# Patient Record
Sex: Male | Born: 1960 | Race: Black or African American | Hispanic: No | Marital: Married | Smoking: Never smoker
Health system: Southern US, Community
[De-identification: ages and names within clinical notes are randomized; demographics above are authoritative.]

## PROBLEM LIST (undated history)

## (undated) DIAGNOSIS — E785 Hyperlipidemia, unspecified: Secondary | ICD-10-CM

## (undated) DIAGNOSIS — I10 Essential (primary) hypertension: Secondary | ICD-10-CM

## (undated) HISTORY — DX: Essential (primary) hypertension: I10

## (undated) HISTORY — DX: Hyperlipidemia, unspecified: E78.5

---

## 2016-10-19 ENCOUNTER — Emergency Department (HOSPITAL_COMMUNITY)
Admission: EM | Admit: 2016-10-19 | Discharge: 2016-10-20 | Disposition: A | Discharge status: Home / Self Care | Payer: Commercial Managed Care - PPO | Attending: Emergency Medicine | Admitting: Emergency Medicine

## 2016-10-19 ENCOUNTER — Encounter (HOSPITAL_COMMUNITY): Payer: Self-pay | Admitting: *Deleted

## 2016-10-19 DIAGNOSIS — Z7982 Long term (current) use of aspirin: Secondary | ICD-10-CM | POA: Insufficient documentation

## 2016-10-19 DIAGNOSIS — N178 Other acute kidney failure: Secondary | ICD-10-CM

## 2016-10-19 DIAGNOSIS — N210 Calculus in bladder: Secondary | ICD-10-CM | POA: Insufficient documentation

## 2016-10-19 DIAGNOSIS — N289 Disorder of kidney and ureter, unspecified: Secondary | ICD-10-CM | POA: Insufficient documentation

## 2016-10-19 DIAGNOSIS — N200 Calculus of kidney: Secondary | ICD-10-CM | POA: Diagnosis not present

## 2016-10-19 DIAGNOSIS — N23 Unspecified renal colic: Secondary | ICD-10-CM

## 2016-10-19 DIAGNOSIS — R112 Nausea with vomiting, unspecified: Secondary | ICD-10-CM

## 2016-10-19 DIAGNOSIS — R197 Diarrhea, unspecified: Secondary | ICD-10-CM

## 2016-10-19 DIAGNOSIS — R1084 Generalized abdominal pain: Secondary | ICD-10-CM | POA: Diagnosis present

## 2016-10-19 DIAGNOSIS — N179 Acute kidney failure, unspecified: Secondary | ICD-10-CM | POA: Insufficient documentation

## 2016-10-19 DIAGNOSIS — R109 Unspecified abdominal pain: Secondary | ICD-10-CM

## 2016-10-19 LAB — URINALYSIS, ROUTINE W REFLEX MICROSCOPIC
Bilirubin Urine: NEGATIVE
Glucose, UA: NEGATIVE mg/dL
Ketones, ur: NEGATIVE mg/dL
LEUKOCYTES UA: NEGATIVE
Nitrite: NEGATIVE
PROTEIN: NEGATIVE mg/dL
SPECIFIC GRAVITY, URINE: 1.025 (ref 1.005–1.030)
pH: 5.5 (ref 5.0–8.0)

## 2016-10-19 LAB — COMPREHENSIVE METABOLIC PANEL
ALBUMIN: 4.8 g/dL (ref 3.5–5.0)
ALK PHOS: 61 U/L (ref 38–126)
ALT: 33 U/L (ref 17–63)
ANION GAP: 13 (ref 5–15)
AST: 32 U/L (ref 15–41)
BILIRUBIN TOTAL: 1.1 mg/dL (ref 0.3–1.2)
BUN: 23 mg/dL — ABNORMAL HIGH (ref 6–20)
CALCIUM: 9.4 mg/dL (ref 8.9–10.3)
CHLORIDE: 101 mmol/L (ref 101–111)
CO2: 19 mmol/L — AB (ref 22–32)
CREATININE: 1.99 mg/dL — AB (ref 0.61–1.24)
GFR, EST AFRICAN AMERICAN: 41 mL/min — AB (ref >60)
GFR, EST NON AFRICAN AMERICAN: 36 mL/min — AB (ref >60)
Glucose, Bld: 110 mg/dL — ABNORMAL HIGH (ref 65–99)
Potassium: 3.6 mmol/L (ref 3.5–5.1)
Sodium: 133 mmol/L — ABNORMAL LOW (ref 135–145)
Total Protein: 8.6 g/dL — ABNORMAL HIGH (ref 6.5–8.1)

## 2016-10-19 LAB — CBC
HCT: 42.1 % (ref 39.0–52.0)
HEMOGLOBIN: 14.7 g/dL (ref 13.0–17.0)
MCH: 30.8 pg (ref 26.0–34.0)
MCHC: 34.9 g/dL (ref 30.0–36.0)
MCV: 88.3 fL (ref 78.0–100.0)
PLATELETS: 302 10*3/uL (ref 150–400)
RBC: 4.77 MIL/uL (ref 4.22–5.81)
RDW: 13 % (ref 11.5–15.5)
WBC: 15.5 10*3/uL — AB (ref 4.0–10.5)

## 2016-10-19 LAB — URINE MICROSCOPIC-ADD ON: SQUAMOUS EPITHELIAL / LPF: NONE SEEN

## 2016-10-19 LAB — LIPASE, BLOOD: Lipase: 25 U/L (ref 11–51)

## 2016-10-19 NOTE — ED Provider Notes (Addendum)
[facility]-EMERGENCY DEPT Provider Note   CSN: [id number] Arrival date & time: 10/19/16  2155  By signing my name below, I, Alyssa GroveMartin Green, attest that this documentation has been prepared under the direction and in the presence of Blane OharaJoshua Tae Robak, MD. Electronically Signed: Alyssa GroveMartin Green, ED Scribe. 10/19/16. 12:37 AM.   History   Chief Complaint Chief Complaint  Patient presents with  . Abdominal Pain   Pt had a beef dog on 11/29 and afterwards experienced abdominal pain, vomiting, nausea, diaphoresis, chills. Pt also noticed deep yellow urine and slight discomfort when urinating. Vomiting and nausea has subsided, but abdominal pain has remained constant. He denies Abdominal PSHx. No PMhx of Kidney stones.   The history is provided by the patient. No language interpreter was used.  Abdominal Pain   This is a new problem. The current episode started more than 2 days ago. The problem occurs hourly. The problem has been gradually improving. The pain is associated with suspicious food intake. The pain is located in the generalized abdominal region. The quality of the pain is sharp and cramping. The pain is moderate. Associated symptoms include fever, nausea, vomiting, constipation and dysuria. Nothing aggravates the symptoms. Nothing relieves the symptoms.    History reviewed. No pertinent past medical history.  There are no active problems to display for this patient.   History reviewed. No pertinent surgical history.   Home Medications    Prior to Admission medications   Medication Sig Start Date End Date Taking? Authorizing Provider  Aspirin-Salicylamide-Caffeine (BC HEADACHE POWDER PO) Take 1 each by mouth every 6 (six) hours as needed (pain).   Yes Historical Provider, MD  Melatonin 1 MG TABS Take 1 mg by mouth once.   Yes Historical Provider, MD    Family History No family history on file.  Social History Social History  Substance Use Topics  . Smoking status: Never Smoker  .  Smokeless tobacco: Never Used  . Alcohol use Yes     Comment: occ   Allergies   Other  Review of Systems Review of Systems  Constitutional: Positive for fever.  Gastrointestinal: Positive for abdominal pain, constipation, nausea and vomiting.  Genitourinary: Positive for dysuria.  All other systems reviewed and are negative.  Physical Exam Updated Vital Signs BP (!) 160/106 (BP Location: Right Arm) Comment: informed the nurse  Pulse 82   Temp 98.5 F (36.9 C) (Oral)   Resp 19   Ht 6' (1.829 m)   Wt 220 lb (99.8 kg)   SpO2 100%   BMI 29.84 kg/m   Physical Exam  Constitutional: He is oriented to person, place, and time. He appears well-developed and well-nourished. He is active. No distress.  HENT:  Head: Normocephalic and atraumatic.  Eyes: Conjunctivae are normal.  Cardiovascular: Normal rate.   Pulmonary/Chest: Effort normal. No respiratory distress.  Musculoskeletal: Normal range of motion.  Neurological: He is alert and oriented to person, place, and time.  Skin: Skin is warm and dry.  Psychiatric: He has a normal mood and affect. His behavior is normal.  Nursing note and vitals reviewed.   ED Treatments / Results  DIAGNOSTIC STUDIES: Oxygen Saturation is 98% on RA, normal by my interpretation.    COORDINATION OF CARE: 12:36 AM Discussed treatment plan with pt at bedside which includes Abdominal US, CT Renal Stone Study and Vicodin  and pt agreed to plan.  Labs (all labs ordered are listed, but only abnormal results are displayed) Labs Reviewed  COMPREHENSIVE METABOLIC PANEL - Abnormal;  Notable for the following:       Result Value   Sodium 133 (*)    CO2 19 (*)    Glucose, Bld 110 (*)    BUN 23 (*)    Creatinine, Ser 1.99 (*)    Total Protein 8.6 (*)    GFR calc non Af Amer 36 (*)    GFR calc Af Amer 41 (*)    All other components within normal limits  CBC - Abnormal; Notable for the following:    WBC 15.5 (*)    All other components within normal  limits  URINALYSIS, ROUTINE W REFLEX MICROSCOPIC (NOT AT [facility]) - Abnormal; Notable for the following:    Hgb urine dipstick SMALL (*)    All other components within normal limits  URINE MICROSCOPIC-ADD ON - Abnormal; Notable for the following:    Bacteria, UA RARE (*)    All other components within normal limits  LIPASE, BLOOD    EKG  EKG Interpretation None       Radiology Ct Renal Stone Study  Result Date: 10/20/2016 CLINICAL DATA:  Abdomen pain with nausea and vomiting EXAM: CT ABDOMEN AND PELVIS WITHOUT CONTRAST TECHNIQUE: Multidetector CT imaging of the abdomen and pelvis was performed following the standard protocol without IV contrast. COMPARISON:  None. FINDINGS: Lower chest: Linear atelectasis or scar at both lung bases. No acute consolidation or pleural effusion. Heart size is nonenlarged. Distal esophagus normal. Hepatobiliary: Mild diffuse decreased density of the hepatic parenchyma consistent with fatty change. Gallbladder is slightly distended, no calcified stones. Mild fatty sparing at the gallbladder fossa. No biliary dilatation. Pancreas: Unremarkable. No pancreatic ductal dilatation or surrounding inflammatory changes. Spleen: Normal in size without focal abnormality. Adrenals/Urinary Tract: Bilateral adrenal glands are within normal limits. Punctate stone in the lower pole left kidney on coronal views. There is mild right-sided hydronephrosis and hydroureter with soft tissue stranding around the right ureter. No calcified stones are seen along the course of the right ureter, however there is a 3 mm stone within the posterior aspect of the bladder. Stomach/Bowel: Stomach is nonenlarged. No dilated small bowel. Mild gaseous dilatation of the colon. Appendix is visualized and is normal. Vascular/Lymphatic: Atherosclerosis of the aorta. No abnormally enlarged lymph nodes. Reproductive: Prostate is unremarkable. Other: No abdominal wall hernia or abnormality. No abdominopelvic  ascites. Musculoskeletal: Degenerative changes of the thoracolumbar spine. Multiple lucent lesions are present, most notable at the endplates of the lumbar vertebra consistent with Schmorl's node. Probable hemangioma in L1. IMPRESSION: 1. Mild right hydronephrosis and hydroureter with soft tissue stranding around the right ureter. There is no ureteral stone seen on the right, however there is a 3 mm stone within the posterior bladder. Findings would be consistent with recently passed stone. 2. Punctate nonobstructing stone in the left kidney 3. Mild fatty infiltration of the liver with focal sparing near the gallbladder fossa. Electronically Signed   By: Jasmine Pang M.D.   On: 10/20/2016 02:31    Procedures Procedures (including critical care time)  EMERGENCY DEPARTMENT BILIARY ULTRASOUND INTERPRETATION "Study: Limited Abdominal Ultrasound of the gallbladder and common bile duct."  INDICATIONS: Abdominal pain Indication: Multiple views of the gallbladder and common bile duct were obtained in real-time with a Multi-frequency probe." PERFORMED BY:  Myself IMAGES ARCHIVED?: Yes FINDINGS: Gallstones absent, Gallbladder wall normal in thickness, Sonographic Ishida's sign absent and Common bile duct normal in size LIMITATIONS: Bowel Gas INTERPRETATION: Normal  CPT Code [phone] (limited abdominal)  Emergency Focused Ultrasound Exam Limited retroperitoneal  ultrasound of kidneys  Performed and interpreted by Dr. Jodi MourningZavitz Indication: flank pain Focused abdominal ultrasound with both kidneys imaged in transverse and longitudinal planes in real-time. Interpretation: no significant hydronephrosis visualized.   Images archived electronically  CPT Code: [id number] (limited retroperitoneal)   Medications Ordered in ED Medications  HYDROcodone-acetaminophen (NORCO/VICODIN) 5-325 MG per tablet 1-2 tablet (1 tablet Oral Given 10/20/16 0135)     Initial Impression / Assessment and Plan / ED Course  I  have reviewed the triage vital signs and the nursing notes.  Pertinent labs & imaging results that were available during my care of the patient were reviewed by me and considered in my medical decision making (see chart for details).  Clinical Course    Intermittent right flank pain, minimal currently.  Mild urinary sxs, no infection. Discussed colon related vs kidney or gallstone.  No gallstone on bedside US, no severe hydro. CT stone  In bladder. Discussed fup for renal fx and htn.  Pt improved, fup urology.  Results and differential diagnosis were discussed with the patient/parent/guardian. Xrays were independently reviewed by myself.  Close follow up outpatient was discussed, comfortable with the plan.   Medications  HYDROcodone-acetaminophen (NORCO/VICODIN) 5-325 MG per tablet 1-2 tablet (1 tablet Oral Given 10/20/16 0135)    Vitals:   10/19/16 2214 10/19/16 2216 10/20/16 0008 10/20/16 0252  BP: (!) 166/112  (!) 159/102 (!) 160/106  Pulse: 86  87 82  Resp: 17  16 19   Temp: 98.5 F (36.9 C)  98.5 F (36.9 C)   TempSrc: Oral  Oral   SpO2: 98%  99% 100%  Weight:  220 lb (99.8 kg)    Height:  6' (1.829 m)      Final diagnoses:  Nausea vomiting and diarrhea  Acute renal failure with other specified pathological lesion in kidney [facility](HCC)  Acute right flank pain  Ureteral colic    Final Clinical Impressions(s) / ED Diagnoses   Final diagnoses:  Nausea vomiting and diarrhea  Acute renal failure with other specified pathological lesion in kidney [facility](HCC)  Acute right flank pain  Ureteral colic    New Prescriptions New Prescriptions   No medications on file     Blane OharaJoshua Faheem Ziemann, MD 10/20/16 0309    Blane OharaJoshua Marena Witts, MD 10/20/16 [phone]

## 2016-10-19 NOTE — ED Triage Notes (Signed)
Pt reports R abd pain with n/v since Wednesday evening.  States he had food poisoning from eating beef sausage.  Pt reports intermittent sharp cramping in R side of his stomach.  Denies n/v today.

## 2016-10-20 ENCOUNTER — Emergency Department (HOSPITAL_COMMUNITY): Payer: Commercial Managed Care - PPO

## 2016-10-20 MED ORDER — HYDROCODONE-ACETAMINOPHEN 5-325 MG PO TABS
ORAL_TABLET | ORAL | Status: AC
  Filled 2016-10-20: qty 1

## 2016-10-20 MED ORDER — HYDROCODONE-ACETAMINOPHEN 5-325 MG PO TABS: 2.0000 | ORAL_TABLET | ORAL | 0 refills | Status: DC | PRN

## 2016-10-20 MED ORDER — HYDROCODONE-ACETAMINOPHEN 5-325 MG PO TABS
1.0000 | ORAL_TABLET | Freq: Once | ORAL | Status: AC
  Administered 2016-10-20: 1 via ORAL

## 2016-10-20 NOTE — Discharge Instructions (Signed)
Strain urine, return for fevers, uncontrolled pain or new symptoms. See urology this week.  If you were given medicines take as directed.  If you are on coumadin or contraceptives realize their levels and effectiveness is altered by many different medicines.  If you have any reaction (rash, tongues swelling, other) to the medicines stop taking and see a physician.    If your blood pressure was elevated in the ER make sure you follow up for management with a primary doctor or return for chest pain, shortness of breath or stroke symptoms.  Please follow up as directed and return to the ER or see a physician for new or worsening symptoms.  Thank you. Vitals:   10/19/16 2214 10/19/16 2216 10/20/16 0008 10/20/16 0252  BP: (!) 166/112  (!) 159/102 (!) 160/106  Pulse: 86  87 82  Resp: 17  16 19   Temp: 98.5 F (36.9 C)  98.5 F (36.9 C)   TempSrc: Oral  Oral   SpO2: 98%  99% 100%  Weight:  220 lb (99.8 kg)    Height:  6' (1.829 m)

## 2017-12-25 ENCOUNTER — Other Ambulatory Visit: Payer: Self-pay

## 2017-12-25 ENCOUNTER — Encounter: Payer: Self-pay | Admitting: Physician Assistant

## 2017-12-25 ENCOUNTER — Ambulatory Visit: Payer: Managed Care, Other (non HMO) | Admitting: Physician Assistant

## 2017-12-25 VITALS — BP 130/100 | HR 83 | Temp 98.4°F | Resp 16 | Ht 71.0 in | Wt 207.0 lb

## 2017-12-25 DIAGNOSIS — Z1211 Encounter for screening for malignant neoplasm of colon: Secondary | ICD-10-CM | POA: Diagnosis not present

## 2017-12-25 DIAGNOSIS — Z1329 Encounter for screening for other suspected endocrine disorder: Secondary | ICD-10-CM | POA: Diagnosis not present

## 2017-12-25 DIAGNOSIS — Z0000 Encounter for general adult medical examination without abnormal findings: Secondary | ICD-10-CM

## 2017-12-25 DIAGNOSIS — Z13220 Encounter for screening for lipoid disorders: Secondary | ICD-10-CM

## 2017-12-25 DIAGNOSIS — Z130 Encounter for screening for diseases of the blood and blood-forming organs and certain disorders involving the immune mechanism: Secondary | ICD-10-CM

## 2017-12-25 DIAGNOSIS — Z125 Encounter for screening for malignant neoplasm of prostate: Secondary | ICD-10-CM

## 2017-12-25 LAB — POCT URINALYSIS DIP (MANUAL ENTRY)
Bilirubin, UA: NEGATIVE
Blood, UA: NEGATIVE
Glucose, UA: NEGATIVE mg/dL
Leukocytes, UA: NEGATIVE
Nitrite, UA: NEGATIVE
Spec Grav, UA: 1.025 (ref 1.010–1.025)
Urobilinogen, UA: 0.2 E.U./dL
pH, UA: 5 (ref 5.0–8.0)

## 2017-12-25 LAB — POC MICROSCOPIC URINALYSIS (UMFC): Mucus: ABSENT

## 2017-12-25 NOTE — Progress Notes (Signed)
Jermone Geister  MRN: [id number] DOB: 1961-09-18  PCP: Patient, No Pcp Per  Subjective:  Pt is a pleasant 57 year old male with no reported PMH presents to clinic for "blood work".  He would like a general health screening. He was requested to come in today by his wife.  He is feeling well today, no complaints.  He is not fasting today.   Pt does not take any medication on a daily basis.  Is sexually active with wife only. Not concerned today for STD screening.  Fhx: healthy. Father had pacemaker.   Colonoscopy never. Declines today. Will consider cologuard.  Hep C screening through work - negative.   Review of Systems  Constitutional: Negative for activity change, appetite change and fatigue.  HENT: Negative for congestion, dental problem, sneezing and tinnitus.   Eyes: Negative for visual disturbance.  Respiratory: Negative for cough, chest tightness, shortness of breath and wheezing.   Cardiovascular: Negative for chest pain, palpitations and leg swelling.  Gastrointestinal: Negative for abdominal pain, blood in stool, constipation, diarrhea, nausea and vomiting.  Endocrine: Negative for polydipsia, polyphagia and polyuria.  Genitourinary: Negative for decreased urine volume, difficulty urinating, discharge, hematuria, scrotal swelling and testicular pain.  Musculoskeletal: Negative for arthralgias, back pain and neck stiffness.  Allergic/Immunologic: Negative for environmental allergies and food allergies.  Neurological: Negative for dizziness, syncope, weakness, light-headedness and headaches.  Psychiatric/Behavioral: Negative for sleep disturbance. The patient is not nervous/anxious.     There are no active problems to display for this patient.   Current Outpatient Medications on File Prior to Visit  Medication Sig Dispense Refill  . Aspirin-Salicylamide-Caffeine (BC HEADACHE POWDER PO) Take 1 each by mouth every 6 (six) hours as needed (pain).    Marland Kitchen  HYDROcodone-acetaminophen (NORCO) 5-325 MG tablet Take 2 tablets by mouth every 4 (four) hours as needed. (Patient not taking: Reported on 12/25/2017) 6 tablet 0  . Melatonin 1 MG TABS Take 1 mg by mouth once.     No current facility-administered medications on file prior to visit.     Allergies  Allergen Reactions  . Other Itching, Swelling and Rash    Food: coleslaw      Objective:  BP 120/90   Pulse 83   Temp 98.4 F (36.9 C) (Oral)   Resp 16   Ht 5' 11"  (1.803 m)   Wt 207 lb (93.9 kg)   SpO2 100%   BMI 28.87 kg/m   Physical Exam  Constitutional: He is oriented to person, place, and time and well-developed, well-nourished, and in no distress. No distress.  Neck: Neck supple. No thyromegaly present.  Cardiovascular: Normal rate, regular rhythm and normal heart sounds.  Pulmonary/Chest: Effort normal and breath sounds normal.  Musculoskeletal:       Right ankle: He exhibits no swelling.       Left ankle: He exhibits no swelling.       Right lower leg: He exhibits no edema.       Left lower leg: He exhibits no edema.  Neurological: He is alert and oriented to person, place, and time. GCS score is 15.  Skin: Skin is warm and dry.  Psychiatric: Mood, memory, affect and judgment normal.  Vitals reviewed.   Assessment and Plan :  1. Encounter for routine adult medical examination - pt presents for routine medical evaluation. He is a new patient here. This is his first exam in several years. Feeling well today. Declines colonoscopy today - feels fine about trying cologuard.  Routine labs are pending, will contact with results. Anticipatory guidance provided. RTC in one year for annual.   2. Screen for colon cancer - Cologuard  3. Screening, lipid - Lipid panel  4. Screening for endocrine disorder - CMP14+EGFR - POCT urinalysis dipstick - POCT Microscopic Urinalysis ([facility])  5. Screening for prostate cancer - PSA  6. Screening for deficiency anemia - CBC with  Differential/Platelet   Mercer Pod, PA-C  [facility] 12/25/2017 10:04 AM

## 2017-12-25 NOTE — Patient Instructions (Addendum)
We will be in contact with you about your lab results.  You will receive Cologuard in the mail with instructions on how to complete the test.  See below for information regarding routine health maintenance for men.  Maintain a healthy diet with regular exercise.  Feel free to come back to our office if you need anything in the future.  Come back in one year for your annual exam.   Thank you for coming in today. I hope you feel we met your needs.  Feel free to call PCP if you have any questions or further requests.  Please consider signing up for MyChart if you do not already have it, as this is a great way to communicate with me.  Best,  Mercer Pod, PA-C   Health Maintenance, Male A healthy lifestyle and preventive care is important for your health and wellness. Ask your health care provider about what schedule of regular examinations is right for you. What should I know about weight and diet? Eat a Healthy Diet  Eat plenty of vegetables, fruits, whole grains, low-fat dairy products, and lean protein.  Do not eat a lot of foods high in solid fats, added sugars, or salt.  Maintain a Healthy Weight Regular exercise can help you achieve or maintain a healthy weight. You should:  Do at least 150 minutes of exercise each week. The exercise should increase your heart rate and make you sweat (moderate-intensity exercise).  Do strength-training exercises at least twice a week.  Watch Your Levels of Cholesterol and Blood Lipids  Have your blood tested for lipids and cholesterol every 5 years starting at 59 years of age. If you are at high risk for heart disease, you should start having your blood tested when you are 59 years old. You may need to have your cholesterol levels checked more often if: ? Your lipid or cholesterol levels are high. ? You are older than 59 years of age. ? You are at high risk for heart disease.  What should I know about cancer screening? Many types of cancers  can be detected early and may often be prevented. Lung Cancer  You should be screened every year for lung cancer if: ? You are a current smoker who has smoked for at least 30 years. ? You are a former smoker who has quit within the past 15 years.  Talk to your health care provider about your screening options, when you should start screening, and how often you should be screened.  Colorectal Cancer  Routine colorectal cancer screening usually begins at 59 years of age and should be repeated every 5-10 years until you are 59 years old. You may need to be screened more often if early forms of precancerous polyps or small growths are found. Your health care provider may recommend screening at an earlier age if you have risk factors for colon cancer.  Your health care provider may recommend using home test kits to check for hidden blood in the stool.  A small camera at the end of a tube can be used to examine your colon (sigmoidoscopy or colonoscopy). This checks for the earliest forms of colorectal cancer.  Prostate and Testicular Cancer  Depending on your age and overall health, your health care provider may do certain tests to screen for prostate and testicular cancer.  Talk to your health care provider about any symptoms or concerns you have about testicular or prostate cancer.  Skin Cancer  Check your skin from head  to toe regularly.  Tell your health care provider about any new moles or changes in moles, especially if: ? There is a change in a mole's size, shape, or color. ? You have a mole that is larger than a pencil eraser.  Always use sunscreen. Apply sunscreen liberally and repeat throughout the day.  Protect yourself by wearing long sleeves, pants, a wide-brimmed hat, and sunglasses when outside.  What should I know about heart disease, diabetes, and high blood pressure?  If you are 27-80 years of age, have your blood pressure checked every 3-5 years. If you are 4 years  of age or older, have your blood pressure checked every year. You should have your blood pressure measured twice-once when you are at a hospital or clinic, and once when you are not at a hospital or clinic. Record the average of the two measurements. To check your blood pressure when you are not at a hospital or clinic, you can use: ? An automated blood pressure machine at a pharmacy. ? A home blood pressure monitor.  Talk to your health care provider about your target blood pressure.  If you are between 43-37 years old, ask your health care provider if you should take aspirin to prevent heart disease.  Have regular diabetes screenings by checking your fasting blood sugar level. ? If you are at a normal weight and have a low risk for diabetes, have this test once every three years after the age of 91. ? If you are overweight and have a high risk for diabetes, consider being tested at a younger age or more often.  A one-time screening for abdominal aortic aneurysm (AAA) by ultrasound is recommended for men aged 27-75 years who are current or former smokers. What should I know about preventing infection? Hepatitis B If you have a higher risk for hepatitis B, you should be screened for this virus. Talk with your health care provider to find out if you are at risk for hepatitis B infection. Hepatitis C Blood testing is recommended for:  Everyone born from [birth year] through 1967.  Anyone with known risk factors for hepatitis C.  Sexually Transmitted Diseases (STDs)  You should be screened each year for STDs including gonorrhea and chlamydia if: ? You are sexually active and are younger than 59 years of age. ? You are older than 59 years of age and your health care provider tells you that you are at risk for this type of infection. ? Your sexual activity has changed since you were last screened and you are at an increased risk for chlamydia or gonorrhea. Ask your health care provider if you are at  risk.  Talk with your health care provider about whether you are at high risk of being infected with HIV. Your health care provider may recommend a prescription medicine to help prevent HIV infection.  What else can I do?  Schedule regular health, dental, and eye exams.  Stay current with your vaccines (immunizations).  Do not use any tobacco products, such as cigarettes, chewing tobacco, and e-cigarettes. If you need help quitting, ask your health care provider.  Limit alcohol intake to no more than 2 drinks per day. One drink equals 12 ounces of beer, 5 ounces of wine, or 1 ounces of hard liquor.  Do not use street drugs.  Do not share needles.  Ask your health care provider for help if you need support or information about quitting drugs.  Tell your health care provider if  you often feel depressed.  Tell your health care provider if you have ever been abused or do not feel safe at home. This information is not intended to replace advice given to you by your health care provider. Make sure you discuss any questions you have with your health care provider. Document Released: 05/03/2008 Document Revised: 07/04/2016 Document Reviewed: 08/09/2015 Elsevier Interactive Patient Education  2018 [employer].   IF you received an x-ray today, you will receive an invoice from [facility]. Please contact [facility] at [phone] with questions or concerns regarding your invoice.   IF you received labwork today, you will receive an invoice from [place] [facility]. Please contact [facility] at [phone] with questions or concerns regarding your invoice.   Our billing staff will not be able to assist you with questions regarding bills from these companies.  You will be contacted with the lab results as soon as they are available. The fastest way to get your results is to activate your My Chart account. Instructions are located on the last page of this paperwork. If you have not  heard from [place] regarding the results in 2 weeks, please contact this office.

## 2017-12-26 LAB — CMP14+EGFR
ALT: 28 IU/L (ref 0–44)
AST: 33 IU/L (ref 0–40)
Albumin/Globulin Ratio: 1.7 (ref 1.2–2.2)
Albumin: 4.8 g/dL (ref 3.5–5.5)
Alkaline Phosphatase: 73 IU/L (ref 39–117)
BUN/Creatinine Ratio: 14 (ref 9–20)
BUN: 13 mg/dL (ref 6–24)
Bilirubin Total: 0.3 mg/dL (ref 0.0–1.2)
CO2: 19 mmol/L — ABNORMAL LOW (ref 20–29)
Calcium: 9.9 mg/dL (ref 8.7–10.2)
Chloride: 102 mmol/L (ref 96–106)
Creatinine, Ser: 0.96 mg/dL (ref 0.76–1.27)
GFR calc Af Amer: 100 mL/min/{1.73_m2} (ref >59)
GFR calc non Af Amer: 87 mL/min/{1.73_m2} (ref >59)
Globulin, Total: 2.9 g/dL (ref 1.5–4.5)
Glucose: 85 mg/dL (ref 65–99)
Potassium: 4.8 mmol/L (ref 3.5–5.2)
Sodium: 139 mmol/L (ref 134–144)
Total Protein: 7.7 g/dL (ref 6.0–8.5)

## 2017-12-26 LAB — CBC WITH DIFFERENTIAL/PLATELET
Basophils Absolute: 0.1 10*3/uL (ref 0.0–0.2)
Basos: 1 %
EOS (ABSOLUTE): 0.3 10*3/uL (ref 0.0–0.4)
Eos: 3 %
Hematocrit: 44.3 % (ref 37.5–51.0)
Hemoglobin: 15 g/dL (ref 13.0–17.7)
Immature Grans (Abs): 0 10*3/uL (ref 0.0–0.1)
Immature Granulocytes: 0 %
Lymphocytes Absolute: 2.2 10*3/uL (ref 0.7–3.1)
Lymphs: 24 %
MCH: 30.1 pg (ref 26.6–33.0)
MCHC: 33.9 g/dL (ref 31.5–35.7)
MCV: 89 fL (ref 79–97)
Monocytes Absolute: 0.6 10*3/uL (ref 0.1–0.9)
Monocytes: 6 %
Neutrophils Absolute: 6.1 10*3/uL (ref 1.4–7.0)
Neutrophils: 66 %
Platelets: 337 10*3/uL (ref 150–379)
RBC: 4.99 x10E6/uL (ref 4.14–5.80)
RDW: 14.7 % (ref 12.3–15.4)
WBC: 9.2 10*3/uL (ref 3.4–10.8)

## 2017-12-26 LAB — LIPID PANEL
Chol/HDL Ratio: 4.5 ratio (ref 0.0–5.0)
Cholesterol, Total: 323 mg/dL — ABNORMAL HIGH (ref 100–199)
HDL: 71 mg/dL (ref >39)
Triglycerides: 567 mg/dL (ref 0–149)

## 2017-12-26 LAB — PSA: Prostate Specific Ag, Serum: 0.9 ng/mL (ref 0.0–4.0)

## 2017-12-30 ENCOUNTER — Encounter: Payer: Self-pay | Admitting: Physician Assistant

## 2017-12-30 DIAGNOSIS — E781 Pure hyperglyceridemia: Secondary | ICD-10-CM

## 2017-12-30 MED ORDER — GEMFIBROZIL 600 MG PO TABS: 600.0000 mg | ORAL_TABLET | Freq: Two times a day (BID) | ORAL | 2 refills | Status: DC

## 2017-12-30 NOTE — Progress Notes (Signed)
Please call pt and let him know his cholesterol levels are very high, especially your triglyceride level. He needs to start a medication to get your cholesterol under control. Diet and exercise also play an important role in controlling these levels.  You should restrict dietary fat greatly, to less than 25 to 40 g daily and avoid excessive alcohol intake.    I sent in a medication to his pharmacy - Start taking Gemfibrazile 600 mg twice daily 30 minutes before breakfast and dinner. Come back and see me in 8-12 weeks to recheck cholesterol levels. Please be fasting at this visit. Do not eat for 8 hours prior to your visit - you may drink water or black coffee.  The rest of his labs look good.  I sent a letter with all of his lab results as well as these instructions in the mail.  Thank you!

## 2018-01-13 ENCOUNTER — Encounter: Payer: Self-pay | Admitting: Physician Assistant

## 2018-01-13 LAB — COLOGUARD: Cologuard: NEGATIVE

## 2018-01-28 NOTE — Progress Notes (Signed)
Cologuard negative. Next test in 3 years.

## 2018-07-08 IMAGING — CT CT RENAL STONE PROTOCOL
2 of 3 series · 16 of 46 positions shown, 18 images · non-contrast
Comparison: None.

CLINICAL DATA: Abdomen pain with nausea and vomiting

EXAM:
CT ABDOMEN AND PELVIS WITHOUT CONTRAST
TECHNIQUE: Multidetector CT imaging of the abdomen and pelvis was performed
following the standard protocol without IV contrast.

[Series 4: lung · axial · 0.77mm/px · z∈[-252,-106]mm · 13 of 85 slices shown, 15 images]
[im 6/85  soft-tissue]
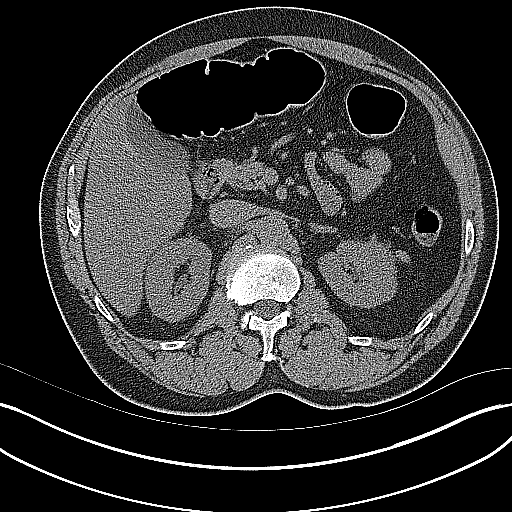
[im 6/85  bone]
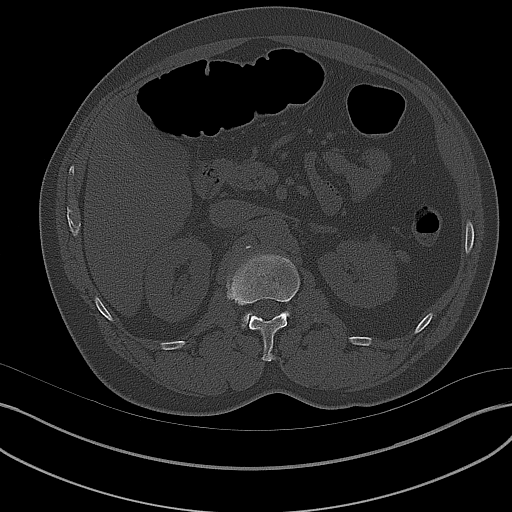
[im 11/85  soft-tissue]
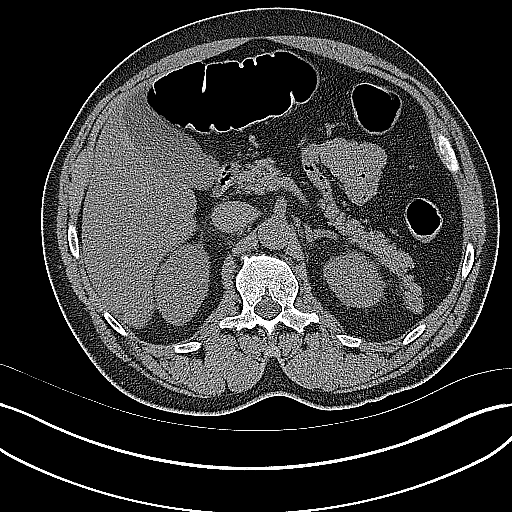
[im 17/85  soft-tissue]
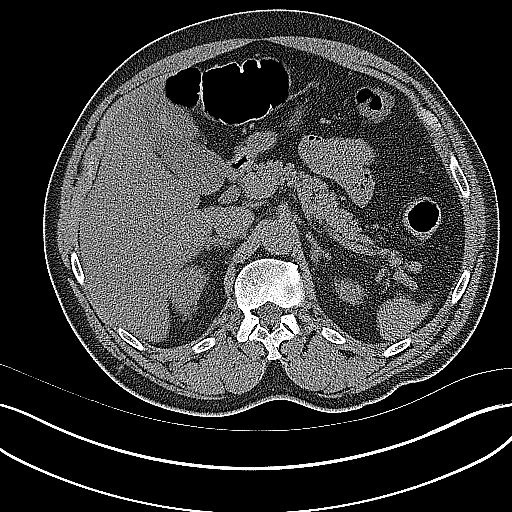
[im 25/85  soft-tissue]
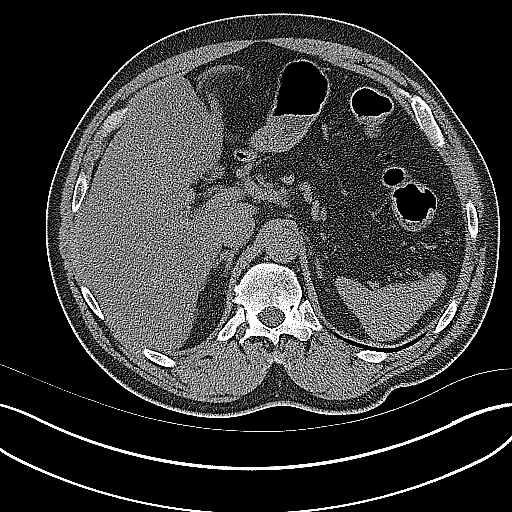
[im 30/85  soft-tissue]
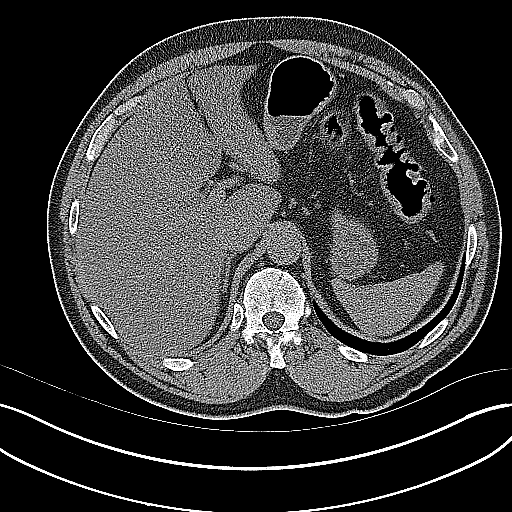
[im 36/85  soft-tissue]
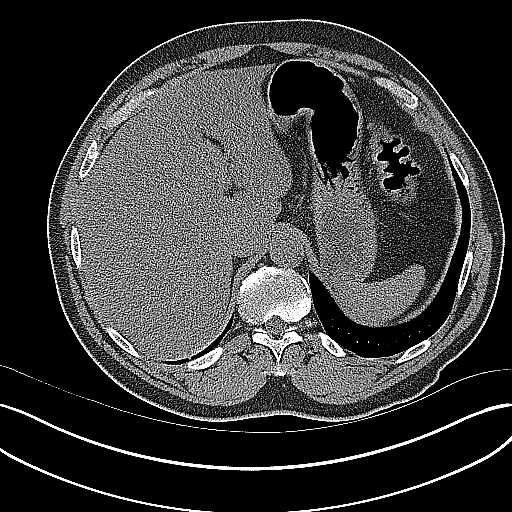
[im 44/85  soft-tissue]
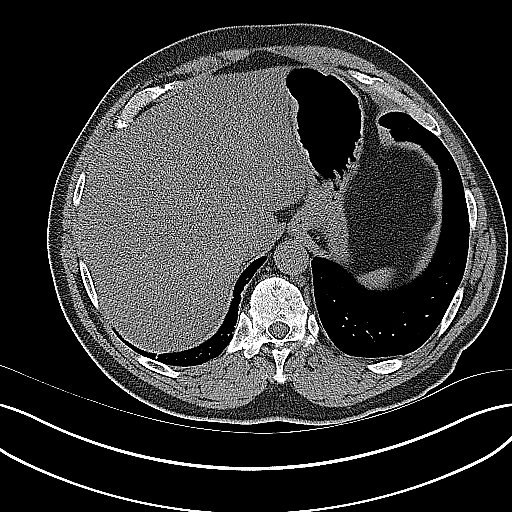
[im 49/85  soft-tissue]
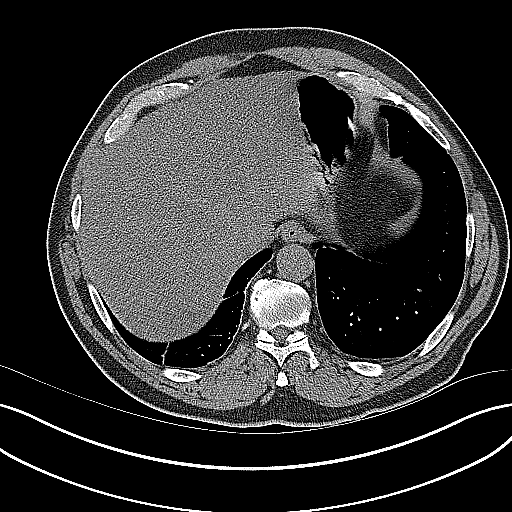
[im 55/85  soft-tissue]
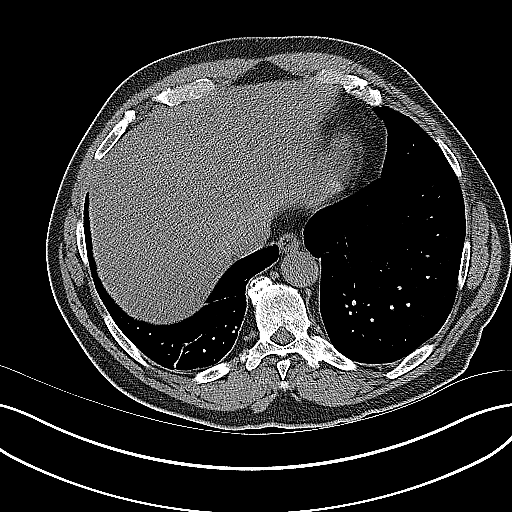
[im 55/85  bone]
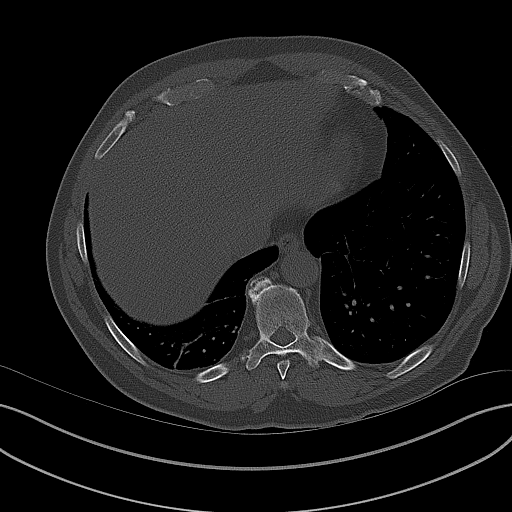
[im 60/85  soft-tissue]
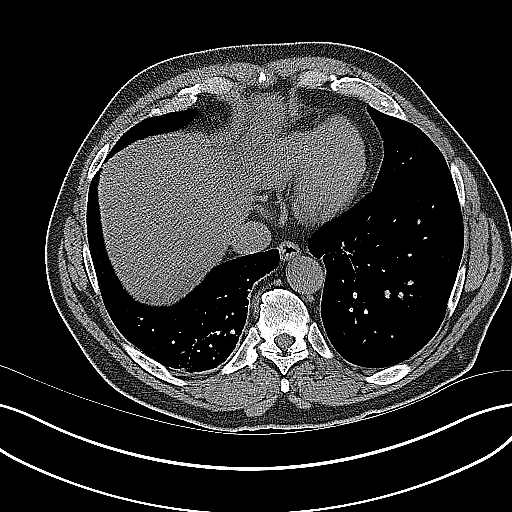
[im 68/85  soft-tissue]
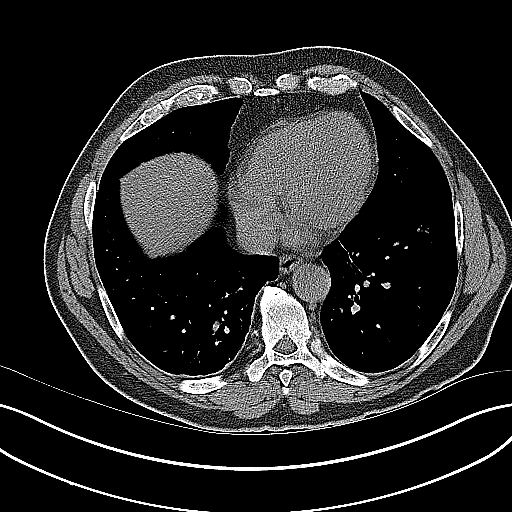
[im 74/85  soft-tissue]
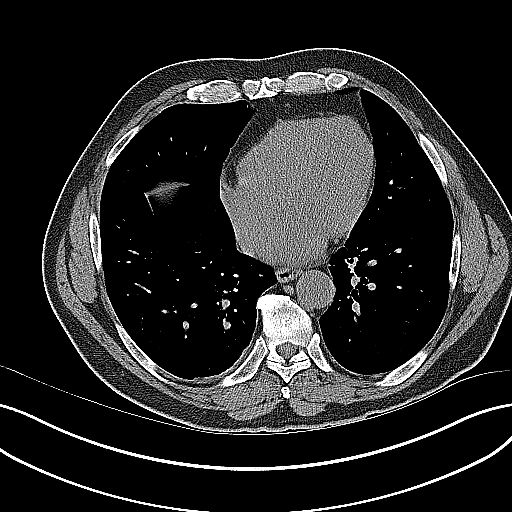
[im 79/85  soft-tissue]
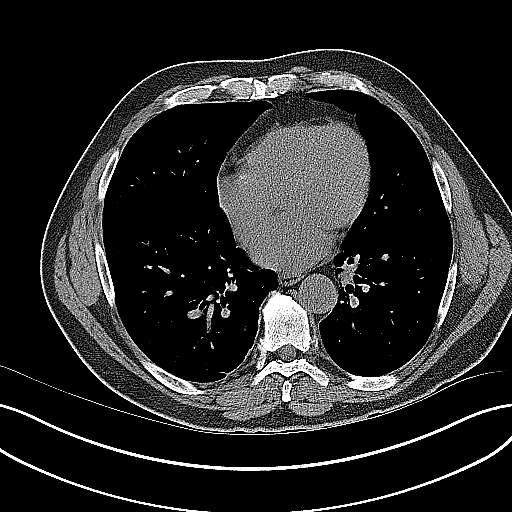

[Series 5: coronal · coronal · 0.74mm/px · 3 of 147 slices shown]
[im 49/147  soft-tissue]
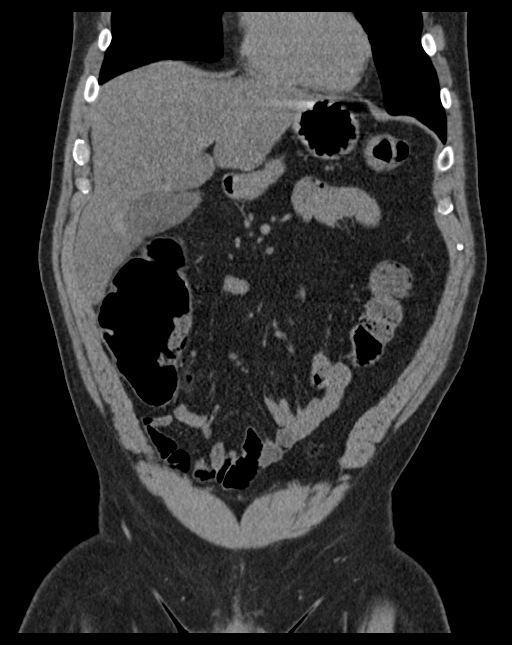
[im 65/147  soft-tissue]
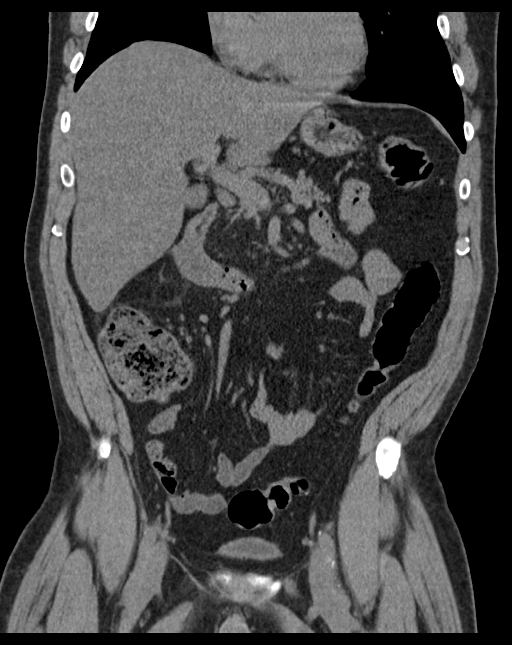
[im 82/147  soft-tissue]
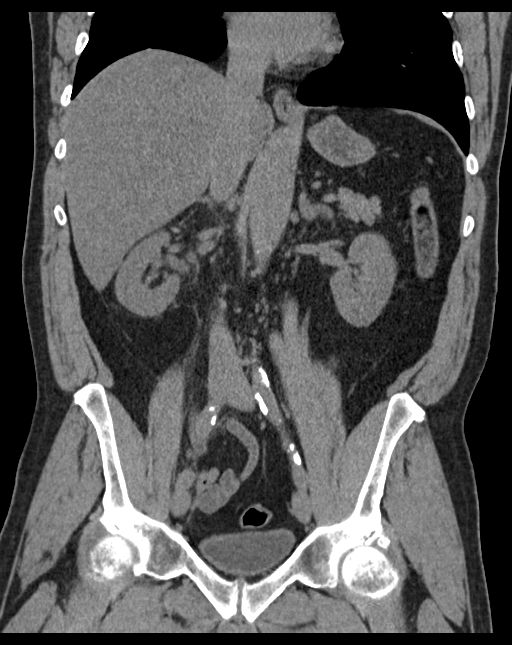

[16 of 46 positions shown; findings below may reference images not displayed]

FINDINGS: Lower chest: Linear atelectasis or scar at both lung bases. No acute
consolidation or pleural effusion. Heart size is nonenlarged. Distal
esophagus normal.

Hepatobiliary: Mild diffuse decreased density of the hepatic
parenchyma consistent with fatty change. Gallbladder is slightly
distended, no calcified stones. Mild fatty sparing at the
gallbladder fossa. No biliary dilatation.

Pancreas: Unremarkable. No pancreatic ductal dilatation or
surrounding inflammatory changes.

Spleen: Normal in size without focal abnormality.

Adrenals/Urinary Tract: Bilateral adrenal glands are within normal
limits. Punctate stone in the lower pole left kidney on coronal
views. There is mild right-sided hydronephrosis and hydroureter with
soft tissue stranding around the right ureter. No calcified stones
are seen along the course of the right ureter, however there is a 3
mm stone within the posterior aspect of the bladder.

Stomach/Bowel: Stomach is nonenlarged. No dilated small bowel. Mild
gaseous dilatation of the colon. Appendix is visualized and is
normal.

Vascular/Lymphatic: Atherosclerosis of the aorta. No abnormally
enlarged lymph nodes.

Reproductive: Prostate is unremarkable.

Other: No abdominal wall hernia or abnormality. No abdominopelvic
ascites.

Musculoskeletal: Degenerative changes of the thoracolumbar spine.
Multiple lucent lesions are present, most notable at the endplates
of the lumbar vertebra consistent with Schmorl's node. Probable
hemangioma in L1.
IMPRESSION: 1. Mild right hydronephrosis and hydroureter with soft tissue
stranding around the right ureter. There is no ureteral stone seen
on the right, however there is a 3 mm stone within the posterior
bladder. Findings would be consistent with recently passed stone.
2. Punctate nonobstructing stone in the left kidney
3. Mild fatty infiltration of the liver with focal sparing near the
gallbladder fossa.

## 2020-02-11 ENCOUNTER — Other Ambulatory Visit: Payer: Self-pay

## 2020-02-11 ENCOUNTER — Ambulatory Visit: Payer: BC Managed Care – PPO | Admitting: Adult Health Nurse Practitioner

## 2020-02-11 VITALS — BP 122/75 | HR 88 | Temp 98.0°F | Ht 72.0 in | Wt 196.0 lb

## 2020-02-11 DIAGNOSIS — E7800 Pure hypercholesterolemia, unspecified: Secondary | ICD-10-CM | POA: Diagnosis not present

## 2020-02-11 DIAGNOSIS — R030 Elevated blood-pressure reading, without diagnosis of hypertension: Secondary | ICD-10-CM

## 2020-02-11 NOTE — Patient Instructions (Signed)
° ° ° °  If you have lab work done today you will be contacted with your lab results within the next 2 weeks.  If you have not heard from us then please contact us. The fastest way to get your results is to register for My Chart. ° ° °IF you received an x-ray today, you will receive an invoice from [facility]. Please contact [facility] at [phone] with questions or concerns regarding your invoice.  ° °IF you received labwork today, you will receive an invoice from [facility]. Please contact [facility] at [phone] with questions or concerns regarding your invoice.  ° °Our billing staff will not be able to assist you with questions regarding bills from these companies. ° °You will be contacted with the lab results as soon as they are available. The fastest way to get your results is to activate your My Chart account. Instructions are located on the last page of this paperwork. If you have not heard from us regarding the results in 2 weeks, please contact this office. °  ° ° ° °

## 2020-02-12 LAB — LIPID PANEL
Chol/HDL Ratio: 5 ratio (ref 0.0–5.0)
Cholesterol, Total: 312 mg/dL — ABNORMAL HIGH (ref 100–199)
HDL: 63 mg/dL (ref >39)
LDL Chol Calc (NIH): 151 mg/dL — ABNORMAL HIGH (ref 0–99)
Triglycerides: 519 mg/dL — ABNORMAL HIGH (ref 0–149)
VLDL Cholesterol Cal: 98 mg/dL — ABNORMAL HIGH (ref 5–40)

## 2020-02-12 LAB — CMP14+EGFR
ALT: 37 IU/L (ref 0–44)
AST: 52 IU/L — ABNORMAL HIGH (ref 0–40)
Albumin/Globulin Ratio: 1.6 (ref 1.2–2.2)
Albumin: 4.6 g/dL (ref 3.8–4.8)
Alkaline Phosphatase: 79 IU/L (ref 39–117)
BUN/Creatinine Ratio: 12 (ref 10–24)
BUN: 10 mg/dL (ref 8–27)
Bilirubin Total: 0.3 mg/dL (ref 0.0–1.2)
CO2: 18 mmol/L — ABNORMAL LOW (ref 20–29)
Calcium: 9.9 mg/dL (ref 8.6–10.2)
Chloride: 99 mmol/L (ref 96–106)
Creatinine, Ser: 0.83 mg/dL (ref 0.76–1.27)
GFR calc Af Amer: 110 mL/min/{1.73_m2} (ref >59)
GFR calc non Af Amer: 95 mL/min/{1.73_m2} (ref >59)
Globulin, Total: 2.8 g/dL (ref 1.5–4.5)
Glucose: 77 mg/dL (ref 65–99)
Potassium: 4.4 mmol/L (ref 3.5–5.2)
Sodium: 132 mmol/L — ABNORMAL LOW (ref 134–144)
Total Protein: 7.4 g/dL (ref 6.0–8.5)

## 2020-02-12 LAB — PSA: Prostate Specific Ag, Serum: 0.8 ng/mL (ref 0.0–4.0)

## 2020-02-12 LAB — TSH: TSH: 1.29 u[IU]/mL (ref 0.450–4.500)

## 2020-02-18 ENCOUNTER — Other Ambulatory Visit: Payer: Self-pay

## 2020-02-18 ENCOUNTER — Ambulatory Visit (INDEPENDENT_AMBULATORY_CARE_PROVIDER_SITE_OTHER): Payer: BC Managed Care – PPO | Admitting: Adult Health Nurse Practitioner

## 2020-02-18 ENCOUNTER — Encounter: Payer: Self-pay | Admitting: Adult Health Nurse Practitioner

## 2020-02-18 VITALS — BP 115/79 | HR 89 | Temp 98.1°F | Ht 72.0 in | Wt 195.8 lb

## 2020-02-18 DIAGNOSIS — E782 Mixed hyperlipidemia: Secondary | ICD-10-CM

## 2020-02-18 DIAGNOSIS — I10 Essential (primary) hypertension: Secondary | ICD-10-CM | POA: Diagnosis not present

## 2020-02-18 MED ORDER — LISINOPRIL 10 MG PO TABS: 10.0000 mg | ORAL_TABLET | Freq: Every day | ORAL | 0 refills | Status: DC

## 2020-02-18 MED ORDER — ATORVASTATIN CALCIUM 40 MG PO TABS: 40.0000 mg | ORAL_TABLET | Freq: Every day | ORAL | 2 refills | Status: DC

## 2020-02-18 MED ORDER — OMEGA-3-ACID ETHYL ESTERS 1 G PO CAPS: 2.0000 g | ORAL_CAPSULE | Freq: Two times a day (BID) | ORAL | 0 refills | Status: DC

## 2020-02-18 NOTE — Patient Instructions (Signed)
° ° ° °  If you have lab work done today you will be contacted with your lab results within the next 2 weeks.  If you have not heard from us then please contact us. The fastest way to get your results is to register for My Chart. ° ° °IF you received an x-ray today, you will receive an invoice from [facility]. Please contact [facility] at [phone] with questions or concerns regarding your invoice.  ° °IF you received labwork today, you will receive an invoice from [facility]. Please contact [facility] at [phone] with questions or concerns regarding your invoice.  ° °Our billing staff will not be able to assist you with questions regarding bills from these companies. ° °You will be contacted with the lab results as soon as they are available. The fastest way to get your results is to activate your My Chart account. Instructions are located on the last page of this paperwork. If you have not heard from us regarding the results in 2 weeks, please contact this office. °  ° ° ° °

## 2020-03-24 ENCOUNTER — Ambulatory Visit: Payer: BC Managed Care – PPO | Admitting: Adult Health Nurse Practitioner

## 2020-03-28 ENCOUNTER — Telehealth: Payer: Self-pay | Admitting: Adult Health Nurse Practitioner

## 2020-03-28 NOTE — Telephone Encounter (Signed)
Called the pt left a message about [employer] on thur

## 2020-03-29 NOTE — Progress Notes (Signed)
   Subjective:  Ryan Galloway is a 59 y.o. male with hypertension.  Bps he has been taking at home have been showing elevation.  He has been taking his wife's lisinopril.     Current Outpatient Medications  Medication Sig Dispense Refill  . Aspirin-Salicylamide-Caffeine (BC HEADACHE POWDER PO) Take 1 each by mouth every 6 (six) hours as needed (pain).    Marland Kitchen atorvastatin (LIPITOR) 40 MG tablet Take 1 tablet (40 mg total) by mouth daily at 6 PM. 30 tablet 2  . gemfibrozil (LOPID) 600 MG tablet Take 1 tablet (600 mg total) by mouth 2 (two) times daily before a meal. (Patient not taking: Reported on 02/11/2020) 60 tablet 2  . HYDROcodone-acetaminophen (NORCO) 5-325 MG tablet Take 2 tablets by mouth every 4 (four) hours as needed. (Patient not taking: Reported on 02/11/2020) 6 tablet 0  . lisinopril (ZESTRIL) 10 MG tablet Take 1 tablet (10 mg total) by mouth daily. 30 tablet 0  . Melatonin 1 MG TABS Take 1 mg by mouth once.    Marland Kitchen omega-3 acid ethyl esters (LOVAZA) 1 g capsule Take 2 capsules (2 g total) by mouth 2 (two) times daily. 120 capsule 0   No current facility-administered medications for this visit.    Hypertension ROS: no TIA's, no chest pain on exertion, no dyspnea on exertion and no swelling of ankles.  .   Objective:  BP 115/79 (BP Location: Right Arm, Patient Position: Sitting, Cuff Size: Normal)   Pulse 89   Temp 98.1 F (36.7 C) (Temporal)   Ht 6' (1.829 m)   Wt 195 lb 12.8 oz (88.8 kg)   SpO2 98%   BMI 26.56 kg/m   Appearance alert, well appearing, and in no distress. General exam BP noted to be well controlled today in office, S1, S2 normal, no gallop, no murmur, chest clear, no JVD, no HSM, no edema.  Lab review: no lab studies available for review at time of visit.   Assessment:   Hypertension well controlled.  1. Essential hypertension   2. Mixed hypercholesterolemia and hypertriglyceridemia   '  Plan:  Meds ordered this encounter  Medications  . atorvastatin  (LIPITOR) 40 MG tablet    Sig: Take 1 tablet (40 mg total) by mouth daily at 6 PM.    Dispense:  30 tablet    Refill:  2  . lisinopril (ZESTRIL) 10 MG tablet    Sig: Take 1 tablet (10 mg total) by mouth daily.    Dispense:  30 tablet    Refill:  0  . omega-3 acid ethyl esters (LOVAZA) 1 g capsule    Sig: Take 2 capsules (2 g total) by mouth 2 (two) times daily.    Dispense:  120 capsule    Refill:  0    F/U in 1 month.   Elyse Jarvis, NP

## 2020-03-31 ENCOUNTER — Ambulatory Visit: Payer: BC Managed Care – PPO | Admitting: Adult Health Nurse Practitioner

## 2020-03-31 ENCOUNTER — Encounter: Payer: Self-pay | Admitting: Family Medicine

## 2020-03-31 ENCOUNTER — Other Ambulatory Visit: Payer: Self-pay

## 2020-03-31 ENCOUNTER — Ambulatory Visit: Payer: BC Managed Care – PPO | Admitting: Family Medicine

## 2020-03-31 VITALS — BP 121/78 | HR 87 | Temp 98.2°F | Resp 15 | Ht 73.0 in | Wt 192.2 lb

## 2020-03-31 DIAGNOSIS — E782 Mixed hyperlipidemia: Secondary | ICD-10-CM

## 2020-03-31 DIAGNOSIS — I10 Essential (primary) hypertension: Secondary | ICD-10-CM | POA: Diagnosis not present

## 2020-03-31 MED ORDER — OMEGA-3-ACID ETHYL ESTERS 1 G PO CAPS: ORAL_CAPSULE | ORAL | 3 refills | Status: AC

## 2020-03-31 MED ORDER — LISINOPRIL 5 MG PO TABS: ORAL_TABLET | ORAL | 3 refills | Status: DC

## 2020-03-31 MED ORDER — ATORVASTATIN CALCIUM 40 MG PO TABS: ORAL_TABLET | ORAL | 3 refills | Status: DC

## 2020-03-31 NOTE — Patient Instructions (Addendum)
  Continue current medications except the lisinopril is being changed to 5 mg daily since you have only been taking the 10 mg every couple of days.  If your pressure seems to go too low on it, try just splitting them in half and taking 2.5 mg daily.  Return in about 3 months at which time your labs will need to be rechecked.  Return sooner if problems arise   If you have lab work done today you will be contacted with your lab results within the next 2 weeks.  If you have not heard from [place] then please contact us. The fastest way to get your results is to register for My Chart.   IF you received an x-ray today, you will receive an invoice from [facility]. Please contact [facility] at [phone] with questions or concerns regarding your invoice.   IF you received labwork today, you will receive an invoice from [facility]. Please contact [facility] at [phone] with questions or concerns regarding your invoice.   Our billing staff will not be able to assist you with questions regarding bills from these companies.  You will be contacted with the lab results as soon as they are available. The fastest way to get your results is to activate your My Chart account. Instructions are located on the last page of this paperwork. If you have not heard from [place] regarding the results in 2 weeks, please contact this office.

## 2020-03-31 NOTE — Progress Notes (Signed)
Subjective:    Patient here for follow-up of elevated blood pressure.  He is exercising and is adherent to a low-salt diet.  Blood pressure is well controlled at home. Cardiac symptoms: none. Patient denies: exertional chest pressure/discomfort, lower extremity edema and palpitations. Cardiovascular risk factors: hypertension and male gender. Use of agents associated with hypertension: none. History of target organ damage: none.  The following portions of the patient's history were reviewed and updated as appropriate: allergies, current medications, past family history, past medical history, past social history, past surgical history and problem list.  Review of Systems Pertinent items noted in HPI and remainder of comprehensive ROS otherwise negative.     Objective:    BP 122/75 (BP Location: Right Arm, Patient Position: Sitting, Cuff Size: Normal)   Pulse 88   Temp 98 F (36.7 C) (Temporal)   Ht 6' (1.829 m)   Wt 196 lb (88.9 kg)   SpO2 99%   BMI 26.58 kg/m  General appearance: alert and cooperative Lungs: clear to auscultation bilaterally Heart: regular rate and rhythm, S1, S2 normal, no murmur, click, rub or gallop Extremities: extremities normal, atraumatic, no cyanosis or edema    Assessment:   1. Elevated BP without diagnosis of hypertension   2. Elevated cholesterol      Plan:   No orders of the defined types were placed in this encounter.  Orders Placed This Encounter  Procedures  . CMP14+EGFR  . TSH  . Lipid panel  . PSA

## 2020-03-31 NOTE — Progress Notes (Signed)
Patient ID: Ryan Galloway, male    DOB: 01-13-1961  Age: 59 y.o. MRN: [id number]  Chief Complaint  Patient presents with  . Hypertension    pt has some hard mornings where BP is elevated, has only been taking lisinopril about 3 days a week to control his numbers. has averaged 120/70 since his last visit     Subjective:   59 year old gentleman who works his job from his home with [employer].  He gets exercise on a exercise bike at home.  He takes his medicines faithfully.  However the lisinopril causes him to drop too low and not feel well so he takes it about every other day.  He denies any chest pains or shortness of breath or other major symptoms or problems.  Current allergies, medications, problem list, past/family and social histories reviewed.  Objective:  BP 121/78   Pulse 87   Temp 98.2 F (36.8 C) (Temporal)   Resp 15   Ht 6\' 1"  (1.854 m)   Wt 192 lb 3.2 oz (87.2 kg)   SpO2 100%   BMI 25.36 kg/m   No major acute distress.  Chest clear.  Heart rate without murmur.  Assessment & Plan:   Assessment: 1. Mixed hypercholesterolemia and hypertriglyceridemia   2. Essential hypertension       Plan: Decided to wait for another 3 months before rechecking his labs since he has only been on the lipid medicines for about 6 weeks.  See the instruction sheet.  No orders of the defined types were placed in this encounter.   Meds ordered this encounter  Medications  . lisinopril (ZESTRIL) 5 MG tablet    Sig: Take 1 daily for blood pressure    Dispense:  90 tablet    Refill:  3  . omega-3 acid ethyl esters (LOVAZA) 1 g capsule    Sig: Take 2 twice daily for lipids    Dispense:  360 capsule    Refill:  3  . atorvastatin (LIPITOR) 40 MG tablet    Sig: Take 1 daily for cholesterol    Dispense:  90 tablet    Refill:  3         Patient Instructions    Continue current medications except the lisinopril is being changed to 5 mg daily since you have only been taking  the 10 mg every couple of days.  If your pressure seems to go too low on it, try just splitting them in half and taking 2.5 mg daily.  Return in about 3 months at which time your labs will need to be rechecked.  Return sooner if problems arise   If you have lab work done today you will be contacted with your lab results within the next 2 weeks.  If you have not heard from [place] then please contact us. The fastest way to get your results is to register for My Chart.   IF you received an x-ray today, you will receive an invoice from [facility]. Please contact [facility] at [phone] with questions or concerns regarding your invoice.   IF you received labwork today, you will receive an invoice from [facility]. Please contact [facility] at [phone] with questions or concerns regarding your invoice.   Our billing staff will not be able to assist you with questions regarding bills from these companies.  You will be contacted with the lab results as soon as they are available. The fastest way to get your results is to activate your My  Chart account. Instructions are located on the last page of this paperwork. If you have not heard from [place] regarding the results in 2 weeks, please contact this office.        Return in about 3 months (around 07/01/2020) for Hypertension and lipid check recheck.   Janace Hoard, MD 03/31/2020

## 2020-04-15 NOTE — Telephone Encounter (Signed)
Called Pt LPMTCB 04/15/20 @ 3:53 PM

## 2020-07-01 ENCOUNTER — Ambulatory Visit: Payer: BC Managed Care – PPO | Admitting: Adult Health Nurse Practitioner

## 2020-07-14 ENCOUNTER — Encounter: Payer: Self-pay | Admitting: Family Medicine

## 2020-07-14 ENCOUNTER — Ambulatory Visit: Payer: BC Managed Care – PPO | Admitting: Family Medicine

## 2020-07-14 ENCOUNTER — Other Ambulatory Visit: Payer: Self-pay

## 2020-07-14 VITALS — BP 130/82 | HR 80 | Temp 98.0°F | Ht 73.0 in | Wt 196.0 lb

## 2020-07-14 DIAGNOSIS — E782 Mixed hyperlipidemia: Secondary | ICD-10-CM | POA: Diagnosis not present

## 2020-07-14 LAB — COMPREHENSIVE METABOLIC PANEL
ALT: 25 IU/L (ref 0–44)
AST: 35 IU/L (ref 0–40)
Albumin/Globulin Ratio: 1.6 (ref 1.2–2.2)
Albumin: 4.4 g/dL (ref 3.8–4.8)
Alkaline Phosphatase: 62 IU/L (ref 48–121)
BUN/Creatinine Ratio: 14 (ref 10–24)
BUN: 14 mg/dL (ref 8–27)
Bilirubin Total: 0.4 mg/dL (ref 0.0–1.2)
CO2: 20 mmol/L (ref 20–29)
Calcium: 9.4 mg/dL (ref 8.6–10.2)
Chloride: 103 mmol/L (ref 96–106)
Creatinine, Ser: 0.99 mg/dL (ref 0.76–1.27)
GFR calc Af Amer: 95 mL/min/{1.73_m2} (ref >59)
GFR calc non Af Amer: 82 mL/min/{1.73_m2} (ref >59)
Globulin, Total: 2.7 g/dL (ref 1.5–4.5)
Glucose: 89 mg/dL (ref 65–99)
Potassium: 4.7 mmol/L (ref 3.5–5.2)
Sodium: 139 mmol/L (ref 134–144)
Total Protein: 7.1 g/dL (ref 6.0–8.5)

## 2020-07-14 LAB — LIPID PANEL
Chol/HDL Ratio: 2.8 ratio (ref 0.0–5.0)
Cholesterol, Total: 180 mg/dL (ref 100–199)
HDL: 65 mg/dL (ref >39)
LDL Chol Calc (NIH): 71 mg/dL (ref 0–99)
Triglycerides: 278 mg/dL — ABNORMAL HIGH (ref 0–149)
VLDL Cholesterol Cal: 44 mg/dL — ABNORMAL HIGH (ref 5–40)

## 2020-07-14 NOTE — Progress Notes (Signed)
Patient ID: Ryan Galloway, male    DOB: 02/01/1961  Age: 59 y.o. MRN: [id number]  Chief Complaint  Patient presents with  . Hyperlipidemia    follow up -     Subjective:  59 year old man who is here for follow-up with regard to his cholesterol.  He was given a prescription for medication last time and is here for follow-up from that.  He has had no adverse effects of the medication.  No other acute complaints or questions.  He had his Covid vaccine  He has never gotten a flu shot and does not want one today.  We talked about that for a long time.  He was strongly advised to get 1.  Current allergies, medications, problem list, past/family and social histories reviewed.  Objective:  BP 130/82   Pulse 80   Temp 98 F (36.7 C)   Ht 6\' 1"  (1.854 m)   Wt 196 lb (88.9 kg)   SpO2 100%   BMI 25.86 kg/m   Physical exam not done, mostly just time discussing things today.  Assessment & Plan:   Assessment: 1. Mixed hypercholesterolemia and hypertriglyceridemia       Plan: See instructions  Orders Placed This Encounter  Procedures  . Lipid panel  . Comprehensive metabolic panel    No orders of the defined types were placed in this encounter.        Patient Instructions     Continue current medications.  I would encourage you to try and get some regular exercise at least 5 days a week.  We will let you know the results of your labs.  If the cholesterol is significantly improved, you should return in about 6 months for recheck.  If it is not much better I will probably need to give you other instructions.  Strongly advise you to get the annual flu shot.  When the booster shots become available for the Covid vaccine and you have been your 61 months from the second dose, you should get a booster.  Continue to try and live wisely with proper social distancing and mask use when needed or appropriate.  Return in 6 months or as needed.  Influenza Virus Vaccine  (Flucelvax) What is this medicine? INFLUENZA VIRUS VACCINE (in floo EN zuh VAHY ruhs vak SEEN) helps to reduce the risk of getting influenza also known as the flu. The vaccine only helps protect you against some strains of the flu. This medicine may be used for other purposes; ask your health care provider or pharmacist if you have questions. COMMON BRAND NAME(S): FLUCELVAX What should I tell my health care provider before I take this medicine? They need to know if you have any of these conditions:  bleeding disorder like hemophilia  fever or infection  Guillain-Barre syndrome or other neurological problems  immune system problems  infection with the human immunodeficiency virus (HIV) or AIDS  low blood platelet counts  multiple sclerosis  an unusual or allergic reaction to influenza virus vaccine, other medicines, foods, dyes or preservatives  pregnant or trying to get pregnant  breast-feeding How should I use this medicine? This vaccine is for injection into a muscle. It is given by a health care professional. A copy of Vaccine Information Statements will be given before each vaccination. Read this sheet carefully each time. The sheet may change frequently. Talk to your pediatrician regarding the use of this medicine in children. Special care may be needed. Overdosage: If you think you've taken too much  of this medicine contact a poison control center or emergency room at once. Overdosage: If you think you have taken too much of this medicine contact a poison control center or emergency room at once. NOTE: This medicine is only for you. Do not share this medicine with others. What if I miss a dose? This does not apply. What may interact with this medicine?  chemotherapy or radiation therapy  medicines that lower your immune system like etanercept, anakinra, infliximab, and adalimumab  medicines that treat or prevent blood clots like warfarin  phenytoin  steroid  medicines like prednisone or cortisone  theophylline  vaccines This list may not describe all possible interactions. Give your health care provider a list of all the medicines, herbs, non-prescription drugs, or dietary supplements you use. Also tell them if you smoke, drink alcohol, or use illegal drugs. Some items may interact with your medicine. What should I watch for while using this medicine? Report any side effects that do not go away within 3 days to your doctor or health care professional. Call your health care provider if any unusual symptoms occur within 6 weeks of receiving this vaccine. You may still catch the flu, but the illness is not usually as bad. You cannot get the flu from the vaccine. The vaccine will not protect against colds or other illnesses that may cause fever. The vaccine is needed every year. What side effects may I notice from receiving this medicine? Side effects that you should report to your doctor or health care professional as soon as possible:  allergic reactions like skin rash, itching or hives, swelling of the face, lips, or tongue Side effects that usually do not require medical attention (Report these to your doctor or health care professional if they continue or are bothersome.):  fever  headache  muscle aches and pains  pain, tenderness, redness, or swelling at the injection site  tiredness This list may not describe all possible side effects. Call your doctor for medical advice about side effects. You may report side effects to FDA at 1-800-FDA-1088. Where should I keep my medicine? The vaccine will be given by a health care professional in a clinic, pharmacy, doctor's office, or other health care setting. You will not be given vaccine doses to store at home. NOTE: This sheet is a summary. It may not cover all possible information. If you have questions about this medicine, talk to your doctor, pharmacist, or health care provider.  2020  Elsevier/Gold Standard (2011-10-17 14:06:47)  If you have lab work done today you will be contacted with your lab results within the next 2 weeks.  If you have not heard from [place] then please contact us. The fastest way to get your results is to register for My Chart.   IF you received an x-ray today, you will receive an invoice from [facility]. Please contact [facility] at [phone] with questions or concerns regarding your invoice.   IF you received labwork today, you will receive an invoice from [place]. Please contact [facility] at [phone] with questions or concerns regarding your invoice.   Our billing staff will not be able to assist you with questions regarding bills from these companies.  You will be contacted with the lab results as soon as they are available. The fastest way to get your results is to activate your My Chart account. Instructions are located on the last page of this paperwork. If you have not heard from [place] regarding the results in 2  weeks, please contact this office.         Return in about 6 months (around 01/14/2021).   Janace Hoard, MD 07/14/2020

## 2020-07-14 NOTE — Patient Instructions (Addendum)
Continue current medications.  I would encourage you to try and get some regular exercise at least 5 days a week.  We will let you know the results of your labs.  If the cholesterol is significantly improved, you should return in about 6 months for recheck.  If it is not much better I will probably need to give you other instructions.  Strongly advise you to get the annual flu shot.  When the booster shots become available for the Covid vaccine and you have been your 8 months from the second dose, you should get a booster.  Continue to try and live wisely with proper social distancing and mask use when needed or appropriate.  Return in 6 months or as needed.  Influenza Virus Vaccine (Flucelvax) What is this medicine? INFLUENZA VIRUS VACCINE (in floo EN zuh VAHY ruhs vak SEEN) helps to reduce the risk of getting influenza also known as the flu. The vaccine only helps protect you against some strains of the flu. This medicine may be used for other purposes; ask your health care provider or pharmacist if you have questions. COMMON BRAND NAME(S): FLUCELVAX What should I tell my health care provider before I take this medicine? They need to know if you have any of these conditions:  bleeding disorder like hemophilia  fever or infection  Guillain-Barre syndrome or other neurological problems  immune system problems  infection with the human immunodeficiency virus (HIV) or AIDS  low blood platelet counts  multiple sclerosis  an unusual or allergic reaction to influenza virus vaccine, other medicines, foods, dyes or preservatives  pregnant or trying to get pregnant  breast-feeding How should I use this medicine? This vaccine is for injection into a muscle. It is given by a health care professional. A copy of Vaccine Information Statements will be given before each vaccination. Read this sheet carefully each time. The sheet may change frequently. Talk to your pediatrician  regarding the use of this medicine in children. Special care may be needed. Overdosage: If you think you've taken too much of this medicine contact a poison control center or emergency room at once. Overdosage: If you think you have taken too much of this medicine contact a poison control center or emergency room at once. NOTE: This medicine is only for you. Do not share this medicine with others. What if I miss a dose? This does not apply. What may interact with this medicine?  chemotherapy or radiation therapy  medicines that lower your immune system like etanercept, anakinra, infliximab, and adalimumab  medicines that treat or prevent blood clots like warfarin  phenytoin  steroid medicines like prednisone or cortisone  theophylline  vaccines This list may not describe all possible interactions. Give your health care provider a list of all the medicines, herbs, non-prescription drugs, or dietary supplements you use. Also tell them if you smoke, drink alcohol, or use illegal drugs. Some items may interact with your medicine. What should I watch for while using this medicine? Report any side effects that do not go away within 3 days to your doctor or health care professional. Call your health care provider if any unusual symptoms occur within 6 weeks of receiving this vaccine. You may still catch the flu, but the illness is not usually as bad. You cannot get the flu from the vaccine. The vaccine will not protect against colds or other illnesses that may cause fever. The vaccine is needed every year. What side effects may I notice from  receiving this medicine? Side effects that you should report to your doctor or health care professional as soon as possible:  allergic reactions like skin rash, itching or hives, swelling of the face, lips, or tongue Side effects that usually do not require medical attention (Report these to your doctor or health care professional if they continue or are  bothersome.):  fever  headache  muscle aches and pains  pain, tenderness, redness, or swelling at the injection site  tiredness This list may not describe all possible side effects. Call your doctor for medical advice about side effects. You may report side effects to FDA at 1-800-FDA-1088. Where should I keep my medicine? The vaccine will be given by a health care professional in a clinic, pharmacy, doctor's office, or other health care setting. You will not be given vaccine doses to store at home. NOTE: This sheet is a summary. It may not cover all possible information. If you have questions about this medicine, talk to your doctor, pharmacist, or health care provider.  2020 Elsevier/Gold Standard (2011-10-17 14:06:47)  If you have lab work done today you will be contacted with your lab results within the next 2 weeks.  If you have not heard from [place] then please contact us. The fastest way to get your results is to register for My Chart.   IF you received an x-ray today, you will receive an invoice from [facility]. Please contact [facility] at [phone] with questions or concerns regarding your invoice.   IF you received labwork today, you will receive an invoice from [facility]. Please contact [facility] at [phone] with questions or concerns regarding your invoice.   Our billing staff will not be able to assist you with questions regarding bills from these companies.  You will be contacted with the lab results as soon as they are available. The fastest way to get your results is to activate your My Chart account. Instructions are located on the last page of this paperwork. If you have not heard from [place] regarding the results in 2 weeks, please contact this office.

## 2020-09-15 ENCOUNTER — Other Ambulatory Visit: Payer: Self-pay

## 2020-09-15 ENCOUNTER — Ambulatory Visit: Payer: BC Managed Care – PPO | Admitting: Emergency Medicine

## 2020-09-15 ENCOUNTER — Encounter: Payer: Self-pay | Admitting: Emergency Medicine

## 2020-09-15 VITALS — BP 139/75 | HR 58 | Temp 97.6°F | Resp 16 | Ht 72.0 in | Wt 196.0 lb

## 2020-09-15 DIAGNOSIS — Z7689 Persons encountering health services in other specified circumstances: Secondary | ICD-10-CM | POA: Diagnosis not present

## 2020-09-15 DIAGNOSIS — E785 Hyperlipidemia, unspecified: Secondary | ICD-10-CM | POA: Diagnosis not present

## 2020-09-15 DIAGNOSIS — I10 Essential (primary) hypertension: Secondary | ICD-10-CM

## 2020-09-15 MED ORDER — LISINOPRIL 10 MG PO TABS: 10.0000 mg | ORAL_TABLET | Freq: Every day | ORAL | 3 refills | Status: AC

## 2020-09-15 NOTE — Patient Instructions (Addendum)
   If you have lab work done today you will be contacted with your lab results within the next 2 weeks.  If you have not heard from us then please contact us. The fastest way to get your results is to register for My Chart.   IF you received an x-ray today, you will receive an invoice from [facility]. Please contact [facility] at [phone] with questions or concerns regarding your invoice.   IF you received labwork today, you will receive an invoice from [facility]. Please contact [facility] at [phone] with questions or concerns regarding your invoice.   Our billing staff will not be able to assist you with questions regarding bills from these companies.  You will be contacted with the lab results as soon as they are available. The fastest way to get your results is to activate your My Chart account. Instructions are located on the last page of this paperwork. If you have not heard from us regarding the results in 2 weeks, please contact this office.      Health Maintenance, Male Adopting a healthy lifestyle and getting preventive care are important in promoting health and wellness. Ask your health care provider about:  The right schedule for you to have regular tests and exams.  Things you can do on your own to prevent diseases and keep yourself healthy. What should I know about diet, weight, and exercise? Eat a healthy diet   Eat a diet that includes plenty of vegetables, fruits, low-fat dairy products, and lean protein.  Do not eat a lot of foods that are high in solid fats, added sugars, or sodium. Maintain a healthy weight Body mass index (BMI) is a measurement that can be used to identify possible weight problems. It estimates body fat based on height and weight. Your health care provider can help determine your BMI and help you achieve or maintain a healthy weight. Get regular exercise Get regular exercise. This is one of the most important things you  can do for your health. Most adults should:  Exercise for at least 150 minutes each week. The exercise should increase your heart rate and make you sweat (moderate-intensity exercise).  Do strengthening exercises at least twice a week. This is in addition to the moderate-intensity exercise.  Spend less time sitting. Even light physical activity can be beneficial. Watch cholesterol and blood lipids Have your blood tested for lipids and cholesterol at 61 years of age, then have this test every 5 years. You may need to have your cholesterol levels checked more often if:  Your lipid or cholesterol levels are high.  You are older than 61 years of age.  You are at high risk for heart disease. What should I know about cancer screening? Many types of cancers can be detected early and may often be prevented. Depending on your health history and family history, you may need to have cancer screening at various ages. This may include screening for:  Colorectal cancer.  Prostate cancer.  Skin cancer.  Lung cancer. What should I know about heart disease, diabetes, and high blood pressure? Blood pressure and heart disease  High blood pressure causes heart disease and increases the risk of stroke. This is more likely to develop in people who have high blood pressure readings, are of African descent, or are overweight.  Talk with your health care provider about your target blood pressure readings.  Have your blood pressure checked: ? Every 3-5 years if you are 18-39   years of age. ? Every year if you are 40 years old or older.  If you are between the ages of 65 and 75 and are a current or former smoker, ask your health care provider if you should have a one-time screening for abdominal aortic aneurysm (AAA). Diabetes Have regular diabetes screenings. This checks your fasting blood sugar level. Have the screening done:  Once every three years after age 45 if you are at a normal weight and have  a low risk for diabetes.  More often and at a younger age if you are overweight or have a high risk for diabetes. What should I know about preventing infection? Hepatitis B If you have a higher risk for hepatitis B, you should be screened for this virus. Talk with your health care provider to find out if you are at risk for hepatitis B infection. Hepatitis C Blood testing is recommended for:  Everyone born from 1945 through 1965.  Anyone with known risk factors for hepatitis C. Sexually transmitted infections (STIs)  You should be screened each year for STIs, including gonorrhea and chlamydia, if: ? You are sexually active and are younger than 61 years of age. ? You are older than 61 years of age and your health care provider tells you that you are at risk for this type of infection. ? Your sexual activity has changed since you were last screened, and you are at increased risk for chlamydia or gonorrhea. Ask your health care provider if you are at risk.  Ask your health care provider about whether you are at high risk for HIV. Your health care provider may recommend a prescription medicine to help prevent HIV infection. If you choose to take medicine to prevent HIV, you should first get tested for HIV. You should then be tested every 3 months for as long as you are taking the medicine. Follow these instructions at home: Lifestyle  Do not use any products that contain nicotine or tobacco, such as cigarettes, e-cigarettes, and chewing tobacco. If you need help quitting, ask your health care provider.  Do not use street drugs.  Do not share needles.  Ask your health care provider for help if you need support or information about quitting drugs. Alcohol use  Do not drink alcohol if your health care provider tells you not to drink.  If you drink alcohol: ? Limit how much you have to 0-2 drinks a day. ? Be aware of how much alcohol is in your drink. In the [place] one drink equals one 12  oz bottle of beer (355 mL), one 5 oz glass of wine (148 mL), or one 1 oz glass of hard liquor (44 mL). General instructions  Schedule regular health, dental, and eye exams.  Stay current with your vaccines.  Tell your health care provider if: ? You often feel depressed. ? You have ever been abused or do not feel safe at home. Summary  Adopting a healthy lifestyle and getting preventive care are important in promoting health and wellness.  Follow your health care provider's instructions about healthy diet, exercising, and getting tested or screened for diseases.  Follow your health care provider's instructions on monitoring your cholesterol and blood pressure. This information is not intended to replace advice given to you by your health care provider. Make sure you discuss any questions you have with your health care provider. Document Revised: 10/29/2018 Document Reviewed: 10/29/2018 Elsevier Patient Education  2020 [employer]  

## 2020-09-15 NOTE — Progress Notes (Signed)
Ryan Galloway 59 y.o.   Chief Complaint  Patient presents with  . Transitions Of Care    former [place] NP  . Hypertension  . Hyperlipidemia    HISTORY OF PRESENT ILLNESS: This is a 59 y.o. male here to establish care with me. History of hypertension and dyslipidemia on atorvastatin and lisinopril. Healthy male with a healthy lifestyle. Has no complaints or medical concerns today.  HPI   Prior to Admission medications   Medication Sig Start Date End Date Taking? Authorizing Provider  atorvastatin (LIPITOR) 40 MG tablet Take 1 daily for cholesterol 03/31/20  Yes Peyton Najjar, MD  lisinopril (ZESTRIL) 5 MG tablet Take 1 daily for blood pressure 03/31/20  Yes Peyton Najjar, MD  omega-3 acid ethyl esters (LOVAZA) 1 g capsule Take 2 twice daily for lipids 03/31/20  Yes Peyton Najjar, MD    Allergies  Allergen Reactions  . Other Itching, Swelling and Rash    Food: coleslaw     Patient Active Problem List   Diagnosis Date Noted  . Dyslipidemia 09/15/2020  . Essential hypertension 02/18/2020  . 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 Hyperlipidemia   . Hypertension     History reviewed. No pertinent surgical history.  Social History   Socioeconomic History  . Marital status: Married    Spouse name: Not on file  . Number of children: Not on file  . Years of education: Not on file  . Highest education level: Not on file  Occupational History  . Not on file  Tobacco Use  . Smoking status: Never Smoker  . Smokeless tobacco: Never Used  Substance and Sexual Activity  . Alcohol use: Yes    Alcohol/week: 1.0 - 2.0 standard drink    Types: 1 - 2 Standard drinks or equivalent per week    Comment: occ  . Drug use: Not on file  . Sexual activity: Not on file  Other Topics Concern  . Not on file  Social History Narrative  . Not on file   Social Determinants of Health   Financial Resource  Strain:   . Difficulty of Paying Living Expenses: Not on file  Food Insecurity:   . Worried About Programme researcher, broadcasting/film/video in the Last Year: Not on file  . Ran Out of Food in the Last Year: Not on file  Transportation Needs:   . Lack of Transportation (Medical): Not on file  . Lack of Transportation (Non-Medical): Not on file  Physical Activity:   . Days of Exercise per Week: Not on file  . Minutes of Exercise per Session: Not on file  Stress:   . Feeling of Stress : Not on file  Social Connections:   . Frequency of Communication with Friends and Family: Not on file  . Frequency of Social Gatherings with Friends and Family: Not on file  . Attends Religious Services: Not on file  . Active Member of Clubs or Organizations: Not on file  . Attends Banker Meetings: Not on file  . Marital Status: Not on file  Intimate Partner Violence:   . Fear of Current or Ex-Partner: Not on file  . Emotionally Abused: Not on file  . Physically Abused: Not on file  . Sexually Abused: Not on file    Family History  Problem Relation Age of Onset  . Heart disease Father      Review of Systems  Constitutional: Negative.  Negative for chills and fever.  HENT: Negative.  Negative for congestion and sore throat.   Respiratory: Negative.  Negative for cough and shortness of breath.   Cardiovascular: Negative.  Negative for chest pain and palpitations.  Gastrointestinal: Negative.  Negative for blood in stool, melena, nausea and vomiting.  Genitourinary: Negative.  Negative for dysuria and hematuria.  Musculoskeletal: Negative.   Skin: Negative.  Negative for rash.  Neurological: Negative.  Negative for dizziness and headaches.  All other systems reviewed and are negative.    Today's Vitals   09/15/20 0837  BP: 139/75  Pulse: (!) 58  Resp: 16  Temp: 97.6 F (36.4 C)  TempSrc: Temporal  SpO2: 97%  Weight: 196 lb (88.9 kg)  Height: 6' (1.829 m)   Body mass index is 26.58  kg/m.   Physical Exam Vitals reviewed.  Constitutional:      Appearance: Normal appearance.  HENT:     Head: Normocephalic.  Eyes:     Extraocular Movements: Extraocular movements intact.     Conjunctiva/sclera: Conjunctivae normal.     Pupils: Pupils are equal, round, and reactive to light.  Neck:     Vascular: No carotid bruit.  Cardiovascular:     Rate and Rhythm: Normal rate and regular rhythm.     Pulses: Normal pulses.     Heart sounds: Normal heart sounds.  Pulmonary:     Effort: Pulmonary effort is normal.     Breath sounds: Normal breath sounds.  Abdominal:     Palpations: Abdomen is soft.     Tenderness: There is no abdominal tenderness.  Musculoskeletal:        General: Normal range of motion.     Cervical back: Normal range of motion and neck supple.  Lymphadenopathy:     Cervical: No cervical adenopathy.  Skin:    General: Skin is warm and dry.     Capillary Refill: Capillary refill takes less than 2 seconds.  Neurological:     General: No focal deficit present.     Mental Status: He is alert and oriented to person, place, and time.  Psychiatric:        Mood and Affect: Mood normal.        Behavior: Behavior normal.      ASSESSMENT & PLAN: Clinically stable.  No medical concerns identified during this visit.  Continue present medications. Follow-up in 6 months. Ryan Galloway was seen today for transitions of care, hypertension and hyperlipidemia.  Diagnoses and all orders for this visit:  Essential hypertension -     lisinopril (ZESTRIL) 10 MG tablet; Take 1 tablet (10 mg total) by mouth daily.  Dyslipidemia  Encounter to establish care    Patient Instructions       If you have lab work done today you will be contacted with your lab results within the next 2 weeks.  If you have not heard from [place] then please contact us. The fastest way to get your results is to register for My Chart.   IF you received an x-ray today, you will receive an  invoice from [facility]. Please contact [facility] at [phone] with questions or concerns regarding your invoice.   IF you received labwork today, you will receive an invoice from [facility]. Please contact [facility] at [phone] with questions or concerns regarding your invoice.   Our billing staff will not be able to assist you with questions regarding bills from these companies.  You will be contacted with the lab results  as soon as they are available. The fastest way to get your results is to activate your My Chart account. Instructions are located on the last page of this paperwork. If you have not heard from [place] regarding the results in 2 weeks, please contact this office.     Health Maintenance, Male Adopting a healthy lifestyle and getting preventive care are important in promoting health and wellness. Ask your health care provider about:  The right schedule for you to have regular tests and exams.  Things you can do on your own to prevent diseases and keep yourself healthy. What should I know about diet, weight, and exercise? Eat a healthy diet   Eat a diet that includes plenty of vegetables, fruits, low-fat dairy products, and lean protein.  Do not eat a lot of foods that are high in solid fats, added sugars, or sodium. Maintain a healthy weight Body mass index (BMI) is a measurement that can be used to identify possible weight problems. It estimates body fat based on height and weight. Your health care provider can help determine your BMI and help you achieve or maintain a healthy weight. Get regular exercise Get regular exercise. This is one of the most important things you can do for your health. Most adults should:  Exercise for at least 150 minutes each week. The exercise should increase your heart rate and make you sweat (moderate-intensity exercise).  Do strengthening exercises at least twice a week. This is in addition to the moderate-intensity  exercise.  Spend less time sitting. Even light physical activity can be beneficial. Watch cholesterol and blood lipids Have your blood tested for lipids and cholesterol at 61 years of age, then have this test every 5 years. You may need to have your cholesterol levels checked more often if:  Your lipid or cholesterol levels are high.  You are older than 61 years of age.  You are at high risk for heart disease. What should I know about cancer screening? Many types of cancers can be detected early and may often be prevented. Depending on your health history and family history, you may need to have cancer screening at various ages. This may include screening for:  Colorectal cancer.  Prostate cancer.  Skin cancer.  Lung cancer. What should I know about heart disease, diabetes, and high blood pressure? Blood pressure and heart disease  High blood pressure causes heart disease and increases the risk of stroke. This is more likely to develop in people who have high blood pressure readings, are of African descent, or are overweight.  Talk with your health care provider about your target blood pressure readings.  Have your blood pressure checked: ? Every 3-5 years if you are 80-24 years of age. ? Every year if you are 54 years old or older.  If you are between the ages of 47 and 9 and are a current or former smoker, ask your health care provider if you should have a one-time screening for abdominal aortic aneurysm (AAA). Diabetes Have regular diabetes screenings. This checks your fasting blood sugar level. Have the screening done:  Once every three years after age 68 if you are at a normal weight and have a low risk for diabetes.  More often and at a younger age if you are overweight or have a high risk for diabetes. What should I know about preventing infection? Hepatitis B If you have a higher risk for hepatitis B, you should be screened for this virus. Talk  with your health care  provider to find out if you are at risk for hepatitis B infection. Hepatitis C Blood testing is recommended for:  Everyone born from [birth year] through 1967.  Anyone with known risk factors for hepatitis C. Sexually transmitted infections (STIs)  You should be screened each year for STIs, including gonorrhea and chlamydia, if: ? You are sexually active and are younger than 61 years of age. ? You are older than 61 years of age and your health care provider tells you that you are at risk for this type of infection. ? Your sexual activity has changed since you were last screened, and you are at increased risk for chlamydia or gonorrhea. Ask your health care provider if you are at risk.  Ask your health care provider about whether you are at high risk for HIV. Your health care provider may recommend a prescription medicine to help prevent HIV infection. If you choose to take medicine to prevent HIV, you should first get tested for HIV. You should then be tested every 3 months for as long as you are taking the medicine. Follow these instructions at home: Lifestyle  Do not use any products that contain nicotine or tobacco, such as cigarettes, e-cigarettes, and chewing tobacco. If you need help quitting, ask your health care provider.  Do not use street drugs.  Do not share needles.  Ask your health care provider for help if you need support or information about quitting drugs. Alcohol use  Do not drink alcohol if your health care provider tells you not to drink.  If you drink alcohol: ? Limit how much you have to 0-2 drinks a day. ? Be aware of how much alcohol is in your drink. In the [place] one drink equals one 12 oz bottle of beer (355 mL), one 5 oz glass of wine (148 mL), or one 1 oz glass of hard liquor (44 mL). General instructions  Schedule regular health, dental, and eye exams.  Stay current with your vaccines.  Tell your health care provider if: ? You often feel depressed. ? You  have ever been abused or do not feel safe at home. Summary  Adopting a healthy lifestyle and getting preventive care are important in promoting health and wellness.  Follow your health care provider's instructions about healthy diet, exercising, and getting tested or screened for diseases.  Follow your health care provider's instructions on monitoring your cholesterol and blood pressure. This information is not intended to replace advice given to you by your health care provider. Make sure you discuss any questions you have with your health care provider. Document Revised: 10/29/2018 Document Reviewed: 10/29/2018 Elsevier Patient Education  2020 [employer]      Edwina Barth, MD [facility]

## 2021-03-15 ENCOUNTER — Other Ambulatory Visit: Payer: Self-pay

## 2021-03-16 ENCOUNTER — Encounter: Payer: Self-pay | Admitting: Emergency Medicine

## 2021-03-16 ENCOUNTER — Ambulatory Visit: Payer: BC Managed Care – PPO | Admitting: Emergency Medicine

## 2021-03-16 VITALS — BP 136/80 | HR 86 | Temp 97.9°F | Ht 72.0 in | Wt 186.4 lb

## 2021-03-16 DIAGNOSIS — E785 Hyperlipidemia, unspecified: Secondary | ICD-10-CM | POA: Diagnosis not present

## 2021-03-16 DIAGNOSIS — M25561 Pain in right knee: Secondary | ICD-10-CM | POA: Diagnosis not present

## 2021-03-16 DIAGNOSIS — G8929 Other chronic pain: Secondary | ICD-10-CM

## 2021-03-16 DIAGNOSIS — I10 Essential (primary) hypertension: Secondary | ICD-10-CM

## 2021-03-16 DIAGNOSIS — Z114 Encounter for screening for human immunodeficiency virus [HIV]: Secondary | ICD-10-CM | POA: Diagnosis not present

## 2021-03-16 DIAGNOSIS — M25562 Pain in left knee: Secondary | ICD-10-CM

## 2021-03-16 LAB — COMPREHENSIVE METABOLIC PANEL
ALT: 29 U/L (ref 0–53)
AST: 40 U/L — ABNORMAL HIGH (ref 0–37)
Albumin: 4.6 g/dL (ref 3.5–5.2)
Alkaline Phosphatase: 64 U/L (ref 39–117)
BUN: 12 mg/dL (ref 6–23)
CO2: 25 mEq/L (ref 19–32)
Calcium: 9.8 mg/dL (ref 8.4–10.5)
Chloride: 102 mEq/L (ref 96–112)
Creatinine, Ser: 0.92 mg/dL (ref 0.40–1.50)
GFR: 89.37 mL/min (ref >60.00)
Glucose, Bld: 97 mg/dL (ref 70–99)
Potassium: 4 mEq/L (ref 3.5–5.1)
Sodium: 137 mEq/L (ref 135–145)
Total Bilirubin: 0.6 mg/dL (ref 0.2–1.2)
Total Protein: 7.7 g/dL (ref 6.0–8.3)

## 2021-03-16 LAB — LIPID PANEL
Cholesterol: 261 mg/dL — ABNORMAL HIGH (ref 0–200)
HDL: 65.8 mg/dL (ref >39.00)
Total CHOL/HDL Ratio: 4
Triglycerides: 594 mg/dL — ABNORMAL HIGH (ref 0.0–149.0)

## 2021-03-16 LAB — LDL CHOLESTEROL, DIRECT: Direct LDL: 111 mg/dL

## 2021-03-16 NOTE — Assessment & Plan Note (Signed)
Fasting lipid profile done today.  Continue atorvastatin 40 mg daily and Lovaza 1 g twice a day.  Diet and nutrition discussed.

## 2021-03-16 NOTE — Progress Notes (Signed)
Lab Results  Component Value Date   CHOL 180 07/14/2020   HDL 65 07/14/2020   LDLCALC 71 07/14/2020   TRIG 278 (H) 07/14/2020   CHOLHDL 2.8 07/14/2020   The 10-year ASCVD risk score Ryan Galloway DC Jr., et al., 2013) is: 15.1%   Values used to calculate the score:     Age: 60 years     Sex: Male     Is Non-Hispanic African American: Yes     Diabetic: No     Tobacco smoker: No     Systolic Blood Pressure: 139 mmHg     Is BP treated: Yes     HDL Cholesterol: 65 mg/dL     Total Cholesterol: 180 mg/dL BP Readings from Last 3 Encounters:  09/15/20 139/75  07/14/20 130/82  03/31/20 121/78   Ryan Galloway 60 y.o.   Chief Complaint  Patient presents with  . Hypertension    Follow up     HISTORY OF PRESENT ILLNESS: This is a 60 y.o. male with history of hypertension on lisinopril 10 mg daily here for follow-up. Blood pressure readings at home within normal limits. Also has history of dyslipidemia on atorvastatin 40 mg daily and Lovaza 1 g twice a day. Has chronic bilateral knee pains. Needs blood work today. Up-to-date with Cologuard. Non-smoker. Overall feels well.  Has no complaints or medical concerns today.  HPI   Prior to Admission medications   Medication Sig Start Date End Date Taking? Authorizing Provider  atorvastatin (LIPITOR) 40 MG tablet Take 1 daily for cholesterol 03/31/20   Ryan Najjar, MD  lisinopril (ZESTRIL) 10 MG tablet Take 1 tablet (10 mg total) by mouth daily. 09/15/20   Georgina Quint, MD  omega-3 acid ethyl esters (LOVAZA) 1 g capsule Take 2 twice daily for lipids 03/31/20   Ryan Najjar, MD    Allergies  Allergen Reactions  . Other Itching, Swelling and Rash    Food: coleslaw     Patient Active Problem List   Diagnosis Date Noted  . Dyslipidemia 09/15/2020  . Essential hypertension 02/18/2020  . 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 Hyperlipidemia   .  Hypertension     History reviewed. No pertinent surgical history.  Social History   Socioeconomic History  . Marital status: Married    Spouse name: Not on file  . Number of children: Not on file  . Years of education: Not on file  . Highest education level: Not on file  Occupational History  . Not on file  Tobacco Use  . Smoking status: Never Smoker  . Smokeless tobacco: Never Used  Substance and Sexual Activity  . Alcohol use: Yes    Alcohol/week: 1.0 - 2.0 standard drink    Types: 1 - 2 Standard drinks or equivalent per week    Comment: occ  . Drug use: Not on file  . Sexual activity: Not on file  Other Topics Concern  . Not on file  Social History Narrative  . Not on file   Social Determinants of Health   Financial Resource Strain: Not on file  Food Insecurity: Not on file  Transportation Needs: Not on file  Physical Activity: Not on file  Stress: Not on file  Social Connections: Not on file  Intimate Partner Violence: Not on file    Family History  Problem Relation Age of Onset  . Heart disease Father      Review of  Systems  Constitutional: Negative.  Negative for fever.  HENT: Negative.  Negative for congestion and sore throat.   Respiratory: Negative.  Negative for cough and shortness of breath.   Cardiovascular: Negative.  Negative for chest pain and palpitations.  Gastrointestinal: Negative.  Negative for abdominal pain, diarrhea, nausea and vomiting.  Genitourinary: Negative.  Negative for dysuria and hematuria.  Musculoskeletal: Positive for joint pain (Bilateral knee pains).  Skin: Negative.  Negative for rash.  Neurological: Negative.  Negative for dizziness and headaches.  All other systems reviewed and are negative.  Today's Vitals   03/16/21 0845 03/16/21 0907  BP: (!) 140/92 136/80  Pulse: 86   Temp: 97.9 F (36.6 C)   TempSrc: Oral   SpO2: 99%   Weight: 186 lb 6.4 oz (84.6 kg)   Height: 6' (1.829 m)    Body mass index is 25.28  kg/m. Wt Readings from Last 3 Encounters:  03/16/21 186 lb 6.4 oz (84.6 kg)  09/15/20 196 lb (88.9 kg)  07/14/20 196 lb (88.9 kg)     Physical Exam Vitals reviewed.  Constitutional:      Appearance: Normal appearance.  HENT:     Head: Normocephalic.  Eyes:     Extraocular Movements: Extraocular movements intact.     Conjunctiva/sclera: Conjunctivae normal.     Pupils: Pupils are equal, round, and reactive to light.  Cardiovascular:     Rate and Rhythm: Normal rate and regular rhythm.     Pulses: Normal pulses.     Heart sounds: Normal heart sounds.  Pulmonary:     Effort: Pulmonary effort is normal.     Breath sounds: Normal breath sounds.  Musculoskeletal:        General: Normal range of motion.     Cervical back: Normal range of motion and neck supple.  Skin:    General: Skin is warm and dry.     Capillary Refill: Capillary refill takes less than 2 seconds.  Neurological:     General: No focal deficit present.     Mental Status: He is alert and oriented to person, place, and time.      ASSESSMENT & PLAN: A total of 30 minutes was spent with the patient and counseling/coordination of care regarding hypertension and dyslipidemia and cardiovascular risks associated with these conditions, review of all medications, education on nutrition, health maintenance items including screening for colon cancer, review of most recent office visit notes, review of most recent blood work results, need for blood work today, prognosis, documentation, general treatment of chronic bilateral knee pains and benefit of walking and physical activity, and need for follow-up.  Essential hypertension Well-controlled hypertension.  Continue lisinopril 10 mg daily. Continue monitoring blood pressure readings at home. Diet and nutrition discussed. Follow-up in 6 months.  Dyslipidemia Fasting lipid profile done today.  Continue atorvastatin 40 mg daily and Lovaza 1 g twice a day.  Diet and  nutrition discussed.  [name] was seen today for hypertension.  Diagnoses and all orders for this visit:  Essential hypertension -     Comprehensive metabolic panel  Dyslipidemia -     Lipid panel  Screening for HIV (human immunodeficiency virus) -     HIV Antibody (routine testing w rflx)  Bilateral chronic knee pain    Patient Instructions   Health Maintenance, Male Adopting a healthy lifestyle and getting preventive care are important in promoting health and wellness. Ask your health care provider about:  The right schedule for you to have regular  tests and exams.  Things you can do on your own to prevent diseases and keep yourself healthy. What should I know about diet, weight, and exercise? Eat a healthy diet  Eat a diet that includes plenty of vegetables, fruits, low-fat dairy products, and lean protein.  Do not eat a lot of foods that are high in solid fats, added sugars, or sodium.   Maintain a healthy weight Body mass index (BMI) is a measurement that can be used to identify possible weight problems. It estimates body fat based on height and weight. Your health care provider can help determine your BMI and help you achieve or maintain a healthy weight. Get regular exercise Get regular exercise. This is one of the most important things you can do for your health. Most adults should:  Exercise for at least 150 minutes each week. The exercise should increase your heart rate and make you sweat (moderate-intensity exercise).  Do strengthening exercises at least twice a week. This is in addition to the moderate-intensity exercise.  Spend less time sitting. Even light physical activity can be beneficial. Watch cholesterol and blood lipids Have your blood tested for lipids and cholesterol at 62 years of age, then have this test every 5 years. You may need to have your cholesterol levels checked more often if:  Your lipid or cholesterol levels are high.  You are older  than 62 years of age.  You are at high risk for heart disease. What should I know about cancer screening? Many types of cancers can be detected early and may often be prevented. Depending on your health history and family history, you may need to have cancer screening at various ages. This may include screening for:  Colorectal cancer.  Prostate cancer.  Skin cancer.  Lung cancer. What should I know about heart disease, diabetes, and high blood pressure? Blood pressure and heart disease  High blood pressure causes heart disease and increases the risk of stroke. This is more likely to develop in people who have high blood pressure readings, are of African descent, or are overweight.  Talk with your health care provider about your target blood pressure readings.  Have your blood pressure checked: ? Every 3-5 years if you are 3-33 years of age. ? Every year if you are 89 years old or older.  If you are between the ages of 65 and 58 and are a current or former smoker, ask your health care provider if you should have a one-time screening for abdominal aortic aneurysm (AAA). Diabetes Have regular diabetes screenings. This checks your fasting blood sugar level. Have the screening done:  Once every three years after age 11 if you are at a normal weight and have a low risk for diabetes.  More often and at a younger age if you are overweight or have a high risk for diabetes. What should I know about preventing infection? Hepatitis B If you have a higher risk for hepatitis B, you should be screened for this virus. Talk with your health care provider to find out if you are at risk for hepatitis B infection. Hepatitis C Blood testing is recommended for:  Everyone born from [birth year] through 1967.  Anyone with known risk factors for hepatitis C. Sexually transmitted infections (STIs)  You should be screened each year for STIs, including gonorrhea and chlamydia, if: ? You are sexually active  and are younger than 62 years of age. ? You are older than 62 years of age and your  health care provider tells you that you are at risk for this type of infection. ? Your sexual activity has changed since you were last screened, and you are at increased risk for chlamydia or gonorrhea. Ask your health care provider if you are at risk.  Ask your health care provider about whether you are at high risk for HIV. Your health care provider may recommend a prescription medicine to help prevent HIV infection. If you choose to take medicine to prevent HIV, you should first get tested for HIV. You should then be tested every 3 months for as long as you are taking the medicine. Follow these instructions at home: Lifestyle  Do not use any products that contain nicotine or tobacco, such as cigarettes, e-cigarettes, and chewing tobacco. If you need help quitting, ask your health care provider.  Do not use street drugs.  Do not share needles.  Ask your health care provider for help if you need support or information about quitting drugs. Alcohol use  Do not drink alcohol if your health care provider tells you not to drink.  If you drink alcohol: ? Limit how much you have to 0-2 drinks a day. ? Be aware of how much alcohol is in your drink. In the [place] one drink equals one 12 oz bottle of beer (355 mL), one 5 oz glass of wine (148 mL), or one 1 oz glass of hard liquor (44 mL). General instructions  Schedule regular health, dental, and eye exams.  Stay current with your vaccines.  Tell your health care provider if: ? You often feel depressed. ? You have ever been abused or do not feel safe at home. Summary  Adopting a healthy lifestyle and getting preventive care are important in promoting health and wellness.  Follow your health care provider's instructions about healthy diet, exercising, and getting tested or screened for diseases.  Follow your health care provider's instructions on  monitoring your cholesterol and blood pressure. This information is not intended to replace advice given to you by your health care provider. Make sure you discuss any questions you have with your health care provider. Document Revised: 10/29/2018 Document Reviewed: 10/29/2018 Elsevier Patient Education  2021 [employer] Hypertension, Adult High blood pressure (hypertension) is when the force of blood pumping through the arteries is too strong. The arteries are the blood vessels that carry blood from the heart throughout the body. Hypertension forces the heart to work harder to pump blood and may cause arteries to become narrow or stiff. Untreated or uncontrolled hypertension can cause a heart attack, heart failure, a stroke, kidney disease, and other problems. A blood pressure reading consists of a higher number over a lower number. Ideally, your blood pressure should be below 120/80. The first ("top") number is called the systolic pressure. It is a measure of the pressure in your arteries as your heart beats. The second ("bottom") number is called the diastolic pressure. It is a measure of the pressure in your arteries as the heart relaxes. What are the causes? The exact cause of this condition is not known. There are some conditions that result in or are related to high blood pressure. What increases the risk? Some risk factors for high blood pressure are under your control. The following factors may make you more likely to develop this condition:  Smoking.  Having type 2 diabetes mellitus, high cholesterol, or both.  Not getting enough exercise or physical activity.  Being overweight.  Having too much fat, sugar,  calories, or salt (sodium) in your diet.  Drinking too much alcohol. Some risk factors for high blood pressure may be difficult or impossible to change. Some of these factors include:  Having chronic kidney disease.  Having a family history of high blood pressure.  Age.  Risk increases with age.  Race. You may be at higher risk if you are African American.  Gender. Men are at higher risk than women before age 62. After age 265, women are at higher risk than men.  Having obstructive sleep apnea.  Stress. What are the signs or symptoms? High blood pressure may not cause symptoms. Very high blood pressure (hypertensive crisis) may cause:  Headache.  Anxiety.  Shortness of breath.  Nosebleed.  Nausea and vomiting.  Vision changes.  Severe chest pain.  Seizures. How is this diagnosed? This condition is diagnosed by measuring your blood pressure while you are seated, with your arm resting on a flat surface, your legs uncrossed, and your feet flat on the floor. The cuff of the blood pressure monitor will be placed directly against the skin of your upper arm at the level of your heart. It should be measured at least twice using the same arm. Certain conditions can cause a difference in blood pressure between your right and left arms. Certain factors can cause blood pressure readings to be lower or higher than normal for a short period of time:  When your blood pressure is higher when you are in a health care provider's office than when you are at home, this is called white coat hypertension. Most people with this condition do not need medicines.  When your blood pressure is higher at home than when you are in a health care provider's office, this is called masked hypertension. Most people with this condition may need medicines to control blood pressure. If you have a high blood pressure reading during one visit or you have normal blood pressure with other risk factors, you may be asked to:  Return on a different day to have your blood pressure checked again.  Monitor your blood pressure at home for 1 week or longer. If you are diagnosed with hypertension, you may have other blood or imaging tests to help your health care provider understand your overall  risk for other conditions. How is this treated? This condition is treated by making healthy lifestyle changes, such as eating healthy foods, exercising more, and reducing your alcohol intake. Your health care provider may prescribe medicine if lifestyle changes are not enough to get your blood pressure under control, and if:  Your systolic blood pressure is above 130.  Your diastolic blood pressure is above 80. Your personal target blood pressure may vary depending on your medical conditions, your age, and other factors. Follow these instructions at home: Eating and drinking  Eat a diet that is high in fiber and potassium, and low in sodium, added sugar, and fat. An example eating plan is called the DASH (Dietary Approaches to Stop Hypertension) diet. To eat this way: ? Eat plenty of fresh fruits and vegetables. Try to fill one half of your plate at each meal with fruits and vegetables. ? Eat whole grains, such as whole-wheat pasta, brown rice, or whole-grain bread. Fill about one fourth of your plate with whole grains. ? Eat or drink low-fat dairy products, such as skim milk or low-fat yogurt. ? Avoid fatty cuts of meat, processed or cured meats, and poultry with skin. Fill about one fourth of your  plate with lean proteins, such as fish, chicken without skin, beans, eggs, or tofu. ? Avoid pre-made and processed foods. These tend to be higher in sodium, added sugar, and fat.  Reduce your daily sodium intake. Most people with hypertension should eat less than 1,500 mg of sodium a day.  Do not drink alcohol if: ? Your health care provider tells you not to drink. ? You are pregnant, may be pregnant, or are planning to become pregnant.  If you drink alcohol: ? Limit how much you use to:  0-1 drink a day for women.  0-2 drinks a day for men. ? Be aware of how much alcohol is in your drink. In the [place] one drink equals one 12 oz bottle of beer (355 mL), one 5 oz glass of wine (148 mL), or  one 1 oz glass of hard liquor (44 mL).   Lifestyle  Work with your health care provider to maintain a healthy body weight or to lose weight. Ask what an ideal weight is for you.  Get at least 30 minutes of exercise most days of the week. Activities may include walking, swimming, or biking.  Include exercise to strengthen your muscles (resistance exercise), such as Pilates or lifting weights, as part of your weekly exercise routine. Try to do these types of exercises for 30 minutes at least 3 days a week.  Do not use any products that contain nicotine or tobacco, such as cigarettes, e-cigarettes, and chewing tobacco. If you need help quitting, ask your health care provider.  Monitor your blood pressure at home as told by your health care provider.  Keep all follow-up visits as told by your health care provider. This is important.   Medicines  Take over-the-counter and prescription medicines only as told by your health care provider. Follow directions carefully. Blood pressure medicines must be taken as prescribed.  Do not skip doses of blood pressure medicine. Doing this puts you at risk for problems and can make the medicine less effective.  Ask your health care provider about side effects or reactions to medicines that you should watch for. Contact a health care provider if you:  Think you are having a reaction to a medicine you are taking.  Have headaches that keep coming back (recurring).  Feel dizzy.  Have swelling in your ankles.  Have trouble with your vision. Get help right away if you:  Develop a severe headache or confusion.  Have unusual weakness or numbness.  Feel faint.  Have severe pain in your chest or abdomen.  Vomit repeatedly.  Have trouble breathing. Summary  Hypertension is when the force of blood pumping through your arteries is too strong. If this condition is not controlled, it may put you at risk for serious complications.  Your personal target  blood pressure may vary depending on your medical conditions, your age, and other factors. For most people, a normal blood pressure is less than 120/80.  Hypertension is treated with lifestyle changes, medicines, or a combination of both. Lifestyle changes include losing weight, eating a healthy, low-sodium diet, exercising more, and limiting alcohol. This information is not intended to replace advice given to you by your health care provider. Make sure you discuss any questions you have with your health care provider. Document Revised: 07/16/2018 Document Reviewed: 07/16/2018 Elsevier Patient Education  2021 [employer] Inc.     Edwina Barth, MD [facility]

## 2021-03-16 NOTE — Patient Instructions (Signed)
Health Maintenance, Male Adopting a healthy lifestyle and getting preventive care are important in promoting health and wellness. Ask your health care provider about:  The right schedule for you to have regular tests and exams.  Things you can do on your own to prevent diseases and keep yourself healthy. What should I know about diet, weight, and exercise? Eat a healthy diet  Eat a diet that includes plenty of vegetables, fruits, low-fat dairy products, and lean protein.  Do not eat a lot of foods that are high in solid fats, added sugars, or sodium.   Maintain a healthy weight Body mass index (BMI) is a measurement that can be used to identify possible weight problems. It estimates body fat based on height and weight. Your health care provider can help determine your BMI and help you achieve or maintain a healthy weight. Get regular exercise Get regular exercise. This is one of the most important things you can do for your health. Most adults should:  Exercise for at least 150 minutes each week. The exercise should increase your heart rate and make you sweat (moderate-intensity exercise).  Do strengthening exercises at least twice a week. This is in addition to the moderate-intensity exercise.  Spend less time sitting. Even light physical activity can be beneficial. Watch cholesterol and blood lipids Have your blood tested for lipids and cholesterol at 62 years of age, then have this test every 5 years. You may need to have your cholesterol levels checked more often if:  Your lipid or cholesterol levels are high.  You are older than 62 years of age.  You are at high risk for heart disease. What should I know about cancer screening? Many types of cancers can be detected early and may often be prevented. Depending on your health history and family history, you may need to have cancer screening at various ages. This may include screening for:  Colorectal cancer.  Prostate  cancer.  Skin cancer.  Lung cancer. What should I know about heart disease, diabetes, and high blood pressure? Blood pressure and heart disease  High blood pressure causes heart disease and increases the risk of stroke. This is more likely to develop in people who have high blood pressure readings, are of African descent, or are overweight.  Talk with your health care provider about your target blood pressure readings.  Have your blood pressure checked: ? Every 3-5 years if you are 18-39 years of age. ? Every year if you are 40 years old or older.  If you are between the ages of 65 and 75 and are a current or former smoker, ask your health care provider if you should have a one-time screening for abdominal aortic aneurysm (AAA). Diabetes Have regular diabetes screenings. This checks your fasting blood sugar level. Have the screening done:  Once every three years after age 45 if you are at a normal weight and have a low risk for diabetes.  More often and at a younger age if you are overweight or have a high risk for diabetes. What should I know about preventing infection? Hepatitis B If you have a higher risk for hepatitis B, you should be screened for this virus. Talk with your health care provider to find out if you are at risk for hepatitis B infection. Hepatitis C Blood testing is recommended for:  Everyone born from 1945 through 1965.  Anyone with known risk factors for hepatitis C. Sexually transmitted infections (STIs)  You should be screened each   year for STIs, including gonorrhea and chlamydia, if: ? You are sexually active and are younger than 62 years of age. ? You are older than 62 years of age and your health care provider tells you that you are at risk for this type of infection. ? Your sexual activity has changed since you were last screened, and you are at increased risk for chlamydia or gonorrhea. Ask your health care provider if you are at risk.  Ask your  health care provider about whether you are at high risk for HIV. Your health care provider may recommend a prescription medicine to help prevent HIV infection. If you choose to take medicine to prevent HIV, you should first get tested for HIV. You should then be tested every 3 months for as long as you are taking the medicine. Follow these instructions at home: Lifestyle  Do not use any products that contain nicotine or tobacco, such as cigarettes, e-cigarettes, and chewing tobacco. If you need help quitting, ask your health care provider.  Do not use street drugs.  Do not share needles.  Ask your health care provider for help if you need support or information about quitting drugs. Alcohol use  Do not drink alcohol if your health care provider tells you not to drink.  If you drink alcohol: ? Limit how much you have to 0-2 drinks a day. ? Be aware of how much alcohol is in your drink. In the [place] one drink equals one 12 oz bottle of beer (355 mL), one 5 oz glass of wine (148 mL), or one 1 oz glass of hard liquor (44 mL). General instructions  Schedule regular health, dental, and eye exams.  Stay current with your vaccines.  Tell your health care provider if: ? You often feel depressed. ? You have ever been abused or do not feel safe at home. Summary  Adopting a healthy lifestyle and getting preventive care are important in promoting health and wellness.  Follow your health care provider's instructions about healthy diet, exercising, and getting tested or screened for diseases.  Follow your health care provider's instructions on monitoring your cholesterol and blood pressure. This information is not intended to replace advice given to you by your health care provider. Make sure you discuss any questions you have with your health care provider. Document Revised: 10/29/2018 Document Reviewed: 10/29/2018 Else[employer] Patient Education  2021 [employer]  Hypertension, Adult High  blood pressure (hypertension) is when the force of blood pumping through the arteries is too strong. The arteries are the blood vessels that carry blood from the heart throughout the body. Hypertension forces the heart to work harder to pump blood and may cause arteries to become narrow or stiff. Untreated or uncontrolled hypertension can cause a heart attack, heart failure, a stroke, kidney disease, and other problems. A blood pressure reading consists of a higher number over a lower number. Ideally, your blood pressure should be below 120/80. The first ("top") number is called the systolic pressure. It is a measure of the pressure in your arteries as your heart beats. The second ("bottom") number is called the diastolic pressure. It is a measure of the pressure in your arteries as the heart relaxes. What are the causes? The exact cause of this condition is not known. There are some conditions that result in or are related to high blood pressure. What increases the risk? Some risk factors for high blood pressure are under your control. The following factors may make you more   likely to develop this condition:  Smoking.  Having type 2 diabetes mellitus, high cholesterol, or both.  Not getting enough exercise or physical activity.  Being overweight.  Having too much fat, sugar, calories, or salt (sodium) in your diet.  Drinking too much alcohol. Some risk factors for high blood pressure may be difficult or impossible to change. Some of these factors include:  Having chronic kidney disease.  Having a family history of high blood pressure.  Age. Risk increases with age.  Race. You may be at higher risk if you are African American.  Gender. Men are at higher risk than women before age 45. After age 65, women are at higher risk than men.  Having obstructive sleep apnea.  Stress. What are the signs or symptoms? High blood pressure may not cause symptoms. Very high blood pressure  (hypertensive crisis) may cause:  Headache.  Anxiety.  Shortness of breath.  Nosebleed.  Nausea and vomiting.  Vision changes.  Severe chest pain.  Seizures. How is this diagnosed? This condition is diagnosed by measuring your blood pressure while you are seated, with your arm resting on a flat surface, your legs uncrossed, and your feet flat on the floor. The cuff of the blood pressure monitor will be placed directly against the skin of your upper arm at the level of your heart. It should be measured at least twice using the same arm. Certain conditions can cause a difference in blood pressure between your right and left arms. Certain factors can cause blood pressure readings to be lower or higher than normal for a short period of time:  When your blood pressure is higher when you are in a health care provider's office than when you are at home, this is called white coat hypertension. Most people with this condition do not need medicines.  When your blood pressure is higher at home than when you are in a health care provider's office, this is called masked hypertension. Most people with this condition may need medicines to control blood pressure. If you have a high blood pressure reading during one visit or you have normal blood pressure with other risk factors, you may be asked to:  Return on a different day to have your blood pressure checked again.  Monitor your blood pressure at home for 1 week or longer. If you are diagnosed with hypertension, you may have other blood or imaging tests to help your health care provider understand your overall risk for other conditions. How is this treated? This condition is treated by making healthy lifestyle changes, such as eating healthy foods, exercising more, and reducing your alcohol intake. Your health care provider may prescribe medicine if lifestyle changes are not enough to get your blood pressure under control, and if:  Your systolic  blood pressure is above 130.  Your diastolic blood pressure is above 80. Your personal target blood pressure may vary depending on your medical conditions, your age, and other factors. Follow these instructions at home: Eating and drinking  Eat a diet that is high in fiber and potassium, and low in sodium, added sugar, and fat. An example eating plan is called the DASH (Dietary Approaches to Stop Hypertension) diet. To eat this way: ? Eat plenty of fresh fruits and vegetables. Try to fill one half of your plate at each meal with fruits and vegetables. ? Eat whole grains, such as whole-wheat pasta, brown rice, or whole-grain bread. Fill about one fourth of your plate with whole grains. ?   Eat or drink low-fat dairy products, such as skim milk or low-fat yogurt. ? Avoid fatty cuts of meat, processed or cured meats, and poultry with skin. Fill about one fourth of your plate with lean proteins, such as fish, chicken without skin, beans, eggs, or tofu. ? Avoid pre-made and processed foods. These tend to be higher in sodium, added sugar, and fat.  Reduce your daily sodium intake. Most people with hypertension should eat less than 1,500 mg of sodium a day.  Do not drink alcohol if: ? Your health care provider tells you not to drink. ? You are pregnant, may be pregnant, or are planning to become pregnant.  If you drink alcohol: ? Limit how much you use to:  0-1 drink a day for women.  0-2 drinks a day for men. ? Be aware of how much alcohol is in your drink. In the [place] one drink equals one 12 oz bottle of beer (355 mL), one 5 oz glass of wine (148 mL), or one 1 oz glass of hard liquor (44 mL).   Lifestyle  Work with your health care provider to maintain a healthy body weight or to lose weight. Ask what an ideal weight is for you.  Get at least 30 minutes of exercise most days of the week. Activities may include walking, swimming, or biking.  Include exercise to strengthen your muscles  (resistance exercise), such as Pilates or lifting weights, as part of your weekly exercise routine. Try to do these types of exercises for 30 minutes at least 3 days a week.  Do not use any products that contain nicotine or tobacco, such as cigarettes, e-cigarettes, and chewing tobacco. If you need help quitting, ask your health care provider.  Monitor your blood pressure at home as told by your health care provider.  Keep all follow-up visits as told by your health care provider. This is important.   Medicines  Take over-the-counter and prescription medicines only as told by your health care provider. Follow directions carefully. Blood pressure medicines must be taken as prescribed.  Do not skip doses of blood pressure medicine. Doing this puts you at risk for problems and can make the medicine less effective.  Ask your health care provider about side effects or reactions to medicines that you should watch for. Contact a health care provider if you:  Think you are having a reaction to a medicine you are taking.  Have headaches that keep coming back (recurring).  Feel dizzy.  Have swelling in your ankles.  Have trouble with your vision. Get help right away if you:  Develop a severe headache or confusion.  Have unusual weakness or numbness.  Feel faint.  Have severe pain in your chest or abdomen.  Vomit repeatedly.  Have trouble breathing. Summary  Hypertension is when the force of blood pumping through your arteries is too strong. If this condition is not controlled, it may put you at risk for serious complications.  Your personal target blood pressure may vary depending on your medical conditions, your age, and other factors. For most people, a normal blood pressure is less than 120/80.  Hypertension is treated with lifestyle changes, medicines, or a combination of both. Lifestyle changes include losing weight, eating a healthy, low-sodium diet, exercising more, and  limiting alcohol. This information is not intended to replace advice given to you by your health care provider. Make sure you discuss any questions you have with your health care provider. Document Revised: 07/16/2018 Document Reviewed:   07/16/2018 [employer]vier Patient Education  2021 [employer]  

## 2021-03-16 NOTE — Assessment & Plan Note (Signed)
Well-controlled hypertension.  Continue lisinopril 10 mg daily. Continue monitoring blood pressure readings at home. Diet and nutrition discussed. Follow-up in 6 months.

## 2021-03-17 LAB — HIV ANTIBODY (ROUTINE TESTING W REFLEX): HIV 1&2 Ab, 4th Generation: NONREACTIVE

## 2021-09-21 ENCOUNTER — Ambulatory Visit: Payer: BC Managed Care – PPO | Admitting: Emergency Medicine

## 2021-09-26 ENCOUNTER — Ambulatory Visit: Payer: BC Managed Care – PPO | Admitting: Emergency Medicine

## 2021-10-04 ENCOUNTER — Ambulatory Visit: Payer: BC Managed Care – PPO | Admitting: Emergency Medicine

## 2021-10-10 ENCOUNTER — Encounter: Payer: Self-pay | Admitting: Emergency Medicine

## 2021-10-10 ENCOUNTER — Ambulatory Visit: Payer: BC Managed Care – PPO | Admitting: Emergency Medicine

## 2021-10-10 ENCOUNTER — Other Ambulatory Visit: Payer: Self-pay

## 2021-10-10 VITALS — BP 138/78 | HR 80 | Temp 98.2°F | Ht 72.0 in | Wt 196.0 lb

## 2021-10-10 DIAGNOSIS — I10 Essential (primary) hypertension: Secondary | ICD-10-CM | POA: Diagnosis not present

## 2021-10-10 DIAGNOSIS — Z1211 Encounter for screening for malignant neoplasm of colon: Secondary | ICD-10-CM

## 2021-10-10 DIAGNOSIS — E785 Hyperlipidemia, unspecified: Secondary | ICD-10-CM

## 2021-10-10 LAB — LIPID PANEL
Cholesterol: 198 mg/dL (ref 0–200)
HDL: 85.3 mg/dL (ref >39.00)
LDL Cholesterol: 95 mg/dL (ref 0–99)
NonHDL: 112.66
Total CHOL/HDL Ratio: 2
Triglycerides: 86 mg/dL (ref 0.0–149.0)
VLDL: 17.2 mg/dL (ref 0.0–40.0)

## 2021-10-10 LAB — COMPREHENSIVE METABOLIC PANEL
ALT: 15 U/L (ref 0–53)
AST: 21 U/L (ref 0–37)
Albumin: 4.2 g/dL (ref 3.5–5.2)
Alkaline Phosphatase: 54 U/L (ref 39–117)
BUN: 13 mg/dL (ref 6–23)
CO2: 27 mEq/L (ref 19–32)
Calcium: 9.3 mg/dL (ref 8.4–10.5)
Chloride: 108 mEq/L (ref 96–112)
Creatinine, Ser: 0.9 mg/dL (ref 0.40–1.50)
GFR: 91.39 mL/min (ref >60.00)
Glucose, Bld: 83 mg/dL (ref 70–99)
Potassium: 4.2 mEq/L (ref 3.5–5.1)
Sodium: 141 mEq/L (ref 135–145)
Total Bilirubin: 0.5 mg/dL (ref 0.2–1.2)
Total Protein: 7 g/dL (ref 6.0–8.3)

## 2021-10-10 LAB — HEMOGLOBIN A1C: Hgb A1c MFr Bld: 5.1 % (ref 4.6–6.5)

## 2021-10-10 NOTE — Progress Notes (Signed)
Ryan Galloway 60 y.o.   Chief Complaint  Patient presents with   Hypertension    6 month F/U    HISTORY OF PRESENT ILLNESS: This is a 60 y.o. male with history of hypertension and dyslipidemia here for follow-up. Blood pressure readings at home 120-130/70-80. Has no complaints or medical concerns today. Needs referral for colonoscopy. Not interested in vaccinations. BP Readings from Last 3 Encounters:  10/10/21 138/78  03/16/21 136/80  09/15/20 139/75     HPI   Prior to Admission medications   Medication Sig Start Date End Date Taking? Authorizing Provider  atorvastatin (LIPITOR) 40 MG tablet Take 1 daily for cholesterol 03/31/20  Yes Posey Boyer, MD  lisinopril (ZESTRIL) 10 MG tablet Take 1 tablet (10 mg total) by mouth daily. 09/15/20  Yes Saxis, Ines Bloomer, MD  omega-3 acid ethyl esters (LOVAZA) 1 g capsule Take 2 twice daily for lipids 03/31/20  Yes Posey Boyer, MD    Allergies  Allergen Reactions   Other Itching, Swelling and Rash    Food: coleslaw     Patient Active Problem List   Diagnosis Date Noted   Dyslipidemia 09/15/2020   Essential hypertension 02/18/2020   Mixed hypercholesterolemia and hypertriglyceridemia 02/18/2020    Past Medical History:  Diagnosis Date   Hyperlipidemia    Hypertension     No past surgical history on file.  Social History   Socioeconomic History   Marital status: Married    Spouse name: Not on file   Number of children: Not on file   Years of education: Not on file   Highest education level: Not on file  Occupational History   Not on file  Tobacco Use   Smoking status: Never   Smokeless tobacco: Never  Substance and Sexual Activity   Alcohol use: Yes    Alcohol/week: 1.0 - 2.0 standard drink    Types: 1 - 2 Standard drinks or equivalent per week    Comment: occ   Drug use: Not on file   Sexual activity: Not on file  Other Topics Concern   Not on file  Social History Narrative   Not on file    Social Determinants of Health   Financial Resource Strain: Not on file  Food Insecurity: Not on file  Transportation Needs: Not on file  Physical Activity: Not on file  Stress: Not on file  Social Connections: Not on file  Intimate Partner Violence: Not on file    Family History  Problem Relation Age of Onset   Heart disease Father      Review of Systems  Constitutional: Negative.  Negative for chills and fever.  HENT: Negative.  Negative for congestion and sore throat.   Respiratory: Negative.  Negative for cough and shortness of breath.   Cardiovascular: Negative.  Negative for chest pain and palpitations.  Gastrointestinal: Negative.  Negative for abdominal pain, diarrhea, nausea and vomiting.  Genitourinary: Negative.  Negative for dysuria and hematuria.  Skin: Negative.  Negative for rash.  Neurological: Negative.  Negative for dizziness and headaches.  All other systems reviewed and are negative.  Today's Vitals   10/10/21 1009  BP: 138/78  Pulse: 80  Temp: 98.2 F (36.8 C)  TempSrc: Oral  SpO2: 98%  Weight: 196 lb (88.9 kg)  Height: 6' (1.829 m)   Body mass index is 26.58 kg/m. Wt Readings from Last 3 Encounters:  10/10/21 196 lb (88.9 kg)  03/16/21 186 lb 6.4 oz (84.6 kg)  09/15/20 196  lb (88.9 kg)    Physical Exam Vitals reviewed.  Constitutional:      Appearance: Normal appearance.  HENT:     Head: Normocephalic.  Eyes:     Extraocular Movements: Extraocular movements intact.     Conjunctiva/sclera: Conjunctivae normal.     Pupils: Pupils are equal, round, and reactive to light.  Cardiovascular:     Rate and Rhythm: Normal rate and regular rhythm.     Pulses: Normal pulses.     Heart sounds: Normal heart sounds.  Pulmonary:     Effort: Pulmonary effort is normal.     Breath sounds: Normal breath sounds.  Musculoskeletal:        General: Normal range of motion.     Cervical back: Normal range of motion and neck supple.     Right lower  leg: No edema.     Left lower leg: No edema.  Skin:    General: Skin is warm and dry.     Capillary Refill: Capillary refill takes less than 2 seconds.  Neurological:     General: No focal deficit present.     Mental Status: He is alert and oriented to person, place, and time.  Psychiatric:        Mood and Affect: Mood normal.        Behavior: Behavior normal.     ASSESSMENT & PLAN: Problem List Items Addressed This Visit       Cardiovascular and Mediastinum   Essential hypertension - Primary    Well-controlled hypertension.  Continue lisinopril 10 mg daily. Dietary approaches to stop hypertension discussed.      Relevant Orders   Hemoglobin A1c   Comprehensive metabolic panel     Other   Dyslipidemia    Diet and nutrition discussed.  Continue atorvastatin 40 mg daily. The 10-year ASCVD risk score (Arnett DK, et al., 2019) is: 16.4%   Values used to calculate the score:     Age: 89 years     Sex: Male     Is Non-Hispanic African American: Yes     Diabetic: No     Tobacco smoker: No     Systolic Blood Pressure: 138 mmHg     Is BP treated: Yes     HDL Cholesterol: 65.8 mg/dL     Total Cholesterol: 261 mg/dL       Relevant Orders   Lipid panel   Other Visit Diagnoses     Colon cancer screening       Relevant Orders   Ambulatory referral to Gastroenterology      Patient Instructions  Hypertension, Adult High blood pressure (hypertension) is when the force of blood pumping through the arteries is too strong. The arteries are the blood vessels that carry blood from the heart throughout the body. Hypertension forces the heart to work harder to pump blood and may cause arteries to become narrow or stiff. Untreated or uncontrolled hypertension can cause a heart attack, heart failure, a stroke, kidney disease, and other problems. A blood pressure reading consists of a higher number over a lower number. Ideally, your blood pressure should be below 120/80. The first  ("top") number is called the systolic pressure. It is a measure of the pressure in your arteries as your heart beats. The second ("bottom") number is called the diastolic pressure. It is a measure of the pressure in your arteries as the heart relaxes. What are the causes? The exact cause of this condition is not known. There are  some conditions that result in or are related to high blood pressure. What increases the risk? Some risk factors for high blood pressure are under your control. The following factors may make you more likely to develop this condition: Smoking. Having type 2 diabetes mellitus, high cholesterol, or both. Not getting enough exercise or physical activity. Being overweight. Having too much fat, sugar, calories, or salt (sodium) in your diet. Drinking too much alcohol. Some risk factors for high blood pressure may be difficult or impossible to change. Some of these factors include: Having chronic kidney disease. Having a family history of high blood pressure. Age. Risk increases with age. Race. You may be at higher risk if you are African American. Gender. Men are at higher risk than women before age 60. After age 87, women are at higher risk than men. Having obstructive sleep apnea. Stress. What are the signs or symptoms? High blood pressure may not cause symptoms. Very high blood pressure (hypertensive crisis) may cause: Headache. Anxiety. Shortness of breath. Nosebleed. Nausea and vomiting. Vision changes. Severe chest pain. Seizures. How is this diagnosed? This condition is diagnosed by measuring your blood pressure while you are seated, with your arm resting on a flat surface, your legs uncrossed, and your feet flat on the floor. The cuff of the blood pressure monitor will be placed directly against the skin of your upper arm at the level of your heart. It should be measured at least twice using the same arm. Certain conditions can cause a difference in blood  pressure between your right and left arms. Certain factors can cause blood pressure readings to be lower or higher than normal for a short period of time: When your blood pressure is higher when you are in a health care provider's office than when you are at home, this is called white coat hypertension. Most people with this condition do not need medicines. When your blood pressure is higher at home than when you are in a health care provider's office, this is called masked hypertension. Most people with this condition may need medicines to control blood pressure. If you have a high blood pressure reading during one visit or you have normal blood pressure with other risk factors, you may be asked to: Return on a different day to have your blood pressure checked again. Monitor your blood pressure at home for 1 week or longer. If you are diagnosed with hypertension, you may have other blood or imaging tests to help your health care provider understand your overall risk for other conditions. How is this treated? This condition is treated by making healthy lifestyle changes, such as eating healthy foods, exercising more, and reducing your alcohol intake. Your health care provider may prescribe medicine if lifestyle changes are not enough to get your blood pressure under control, and if: Your systolic blood pressure is above 130. Your diastolic blood pressure is above 80. Your personal target blood pressure may vary depending on your medical conditions, your age, and other factors. Follow these instructions at home: Eating and drinking  Eat a diet that is high in fiber and potassium, and low in sodium, added sugar, and fat. An example eating plan is called the DASH (Dietary Approaches to Stop Hypertension) diet. To eat this way: Eat plenty of fresh fruits and vegetables. Try to fill one half of your plate at each meal with fruits and vegetables. Eat whole grains, such as whole-wheat pasta, brown rice,  or whole-grain bread. Fill about one fourth of  your plate with whole grains. Eat or drink low-fat dairy products, such as skim milk or low-fat yogurt. Avoid fatty cuts of meat, processed or cured meats, and poultry with skin. Fill about one fourth of your plate with lean proteins, such as fish, chicken without skin, beans, eggs, or tofu. Avoid pre-made and processed foods. These tend to be higher in sodium, added sugar, and fat. Reduce your daily sodium intake. Most people with hypertension should eat less than 1,500 mg of sodium a day. Do not drink alcohol if: Your health care provider tells you not to drink. You are pregnant, may be pregnant, or are planning to become pregnant. If you drink alcohol: Limit how much you use to: 0-1 drink a day for women. 0-2 drinks a day for men. Be aware of how much alcohol is in your drink. In the [place] one drink equals one 12 oz bottle of beer (355 mL), one 5 oz glass of wine (148 mL), or one 1 oz glass of hard liquor (44 mL). Lifestyle  Work with your health care provider to maintain a healthy body weight or to lose weight. Ask what an ideal weight is for you. Get at least 30 minutes of exercise most days of the week. Activities may include walking, swimming, or biking. Include exercise to strengthen your muscles (resistance exercise), such as Pilates or lifting weights, as part of your weekly exercise routine. Try to do these types of exercises for 30 minutes at least 3 days a week. Do not use any products that contain nicotine or tobacco, such as cigarettes, e-cigarettes, and chewing tobacco. If you need help quitting, ask your health care provider. Monitor your blood pressure at home as told by your health care provider. Keep all follow-up visits as told by your health care provider. This is important. Medicines Take over-the-counter and prescription medicines only as told by your health care provider. Follow directions carefully. Blood pressure  medicines must be taken as prescribed. Do not skip doses of blood pressure medicine. Doing this puts you at risk for problems and can make the medicine less effective. Ask your health care provider about side effects or reactions to medicines that you should watch for. Contact a health care provider if you: Think you are having a reaction to a medicine you are taking. Have headaches that keep coming back (recurring). Feel dizzy. Have swelling in your ankles. Have trouble with your vision. Get help right away if you: Develop a severe headache or confusion. Have unusual weakness or numbness. Feel faint. Have severe pain in your chest or abdomen. Vomit repeatedly. Have trouble breathing. Summary Hypertension is when the force of blood pumping through your arteries is too strong. If this condition is not controlled, it may put you at risk for serious complications. Your personal target blood pressure may vary depending on your medical conditions, your age, and other factors. For most people, a normal blood pressure is less than 120/80. Hypertension is treated with lifestyle changes, medicines, or a combination of both. Lifestyle changes include losing weight, eating a healthy, low-sodium diet, exercising more, and limiting alcohol. This information is not intended to replace advice given to you by your health care provider. Make sure you discuss any questions you have with your health care provider. Document Revised: 07/16/2018 Document Reviewed: 07/16/2018 Elsevier Patient Education  2022 [place], MD [facility]

## 2021-10-10 NOTE — Patient Instructions (Signed)

## 2021-10-10 NOTE — Assessment & Plan Note (Signed)
Diet and nutrition discussed.  Continue atorvastatin 40 mg daily. The 10-year ASCVD risk score (Arnett DK, et al., 2019) is: 16.4%   Values used to calculate the score:     Age: 60 years     Sex: Male     Is Non-Hispanic African American: Yes     Diabetic: No     Tobacco smoker: No     Systolic Blood Pressure: 138 mmHg     Is BP treated: Yes     HDL Cholesterol: 65.8 mg/dL     Total Cholesterol: 261 mg/dL

## 2021-10-10 NOTE — Assessment & Plan Note (Signed)
Well-controlled hypertension.  Continue lisinopril 10 mg daily. Dietary approaches to stop hypertension discussed.

## 2021-10-12 DIAGNOSIS — E7800 Pure hypercholesterolemia, unspecified: Secondary | ICD-10-CM | POA: Diagnosis not present

## 2021-10-12 DIAGNOSIS — T781XXA Other adverse food reactions, not elsewhere classified, initial encounter: Secondary | ICD-10-CM | POA: Diagnosis not present

## 2021-10-12 DIAGNOSIS — X58XXXA Exposure to other specified factors, initial encounter: Secondary | ICD-10-CM | POA: Diagnosis not present

## 2021-10-12 DIAGNOSIS — L500 Allergic urticaria: Secondary | ICD-10-CM | POA: Diagnosis not present

## 2021-10-12 DIAGNOSIS — R220 Localized swelling, mass and lump, head: Secondary | ICD-10-CM | POA: Diagnosis not present

## 2021-10-12 DIAGNOSIS — T7840XA Allergy, unspecified, initial encounter: Secondary | ICD-10-CM | POA: Diagnosis not present

## 2023-01-10 ENCOUNTER — Ambulatory Visit: Payer: Self-pay | Admitting: Emergency Medicine

## 2023-03-05 ENCOUNTER — Encounter: Payer: Self-pay | Admitting: Emergency Medicine

## 2023-03-05 ENCOUNTER — Ambulatory Visit: Payer: BC Managed Care – PPO | Admitting: Emergency Medicine

## 2023-03-05 VITALS — BP 124/76 | HR 76 | Temp 98.2°F | Ht 72.0 in | Wt 179.2 lb

## 2023-03-05 DIAGNOSIS — E785 Hyperlipidemia, unspecified: Secondary | ICD-10-CM | POA: Diagnosis not present

## 2023-03-05 DIAGNOSIS — I10 Essential (primary) hypertension: Secondary | ICD-10-CM | POA: Diagnosis not present

## 2023-03-05 DIAGNOSIS — Z1211 Encounter for screening for malignant neoplasm of colon: Secondary | ICD-10-CM

## 2023-03-05 LAB — COMPREHENSIVE METABOLIC PANEL
ALT: 19 U/L (ref 0–53)
AST: 29 U/L (ref 0–37)
Albumin: 4.4 g/dL (ref 3.5–5.2)
Alkaline Phosphatase: 67 U/L (ref 39–117)
BUN: 10 mg/dL (ref 6–23)
CO2: 27 mEq/L (ref 19–32)
Calcium: 9.6 mg/dL (ref 8.4–10.5)
Chloride: 103 mEq/L (ref 96–112)
Creatinine, Ser: 0.85 mg/dL (ref 0.40–1.50)
GFR: 92.08 mL/min (ref >60.00)
Glucose, Bld: 102 mg/dL — ABNORMAL HIGH (ref 70–99)
Potassium: 3.9 mEq/L (ref 3.5–5.1)
Sodium: 138 mEq/L (ref 135–145)
Total Bilirubin: 0.6 mg/dL (ref 0.2–1.2)
Total Protein: 7.2 g/dL (ref 6.0–8.3)

## 2023-03-05 LAB — HEMOGLOBIN A1C: Hgb A1c MFr Bld: 5.3 % (ref 4.6–6.5)

## 2023-03-05 LAB — CBC WITH DIFFERENTIAL/PLATELET
Basophils Absolute: 0.1 10*3/uL (ref 0.0–0.1)
Basophils Relative: 0.6 % (ref 0.0–3.0)
Eosinophils Absolute: 0.1 10*3/uL (ref 0.0–0.7)
Eosinophils Relative: 1 % (ref 0.0–5.0)
HCT: 39.1 % (ref 39.0–52.0)
Hemoglobin: 13 g/dL (ref 13.0–17.0)
Lymphocytes Relative: 16.4 % (ref 12.0–46.0)
Lymphs Abs: 1.6 10*3/uL (ref 0.7–4.0)
MCHC: 33.4 g/dL (ref 30.0–36.0)
MCV: 93.8 fl (ref 78.0–100.0)
Monocytes Absolute: 0.6 10*3/uL (ref 0.1–1.0)
Monocytes Relative: 6.2 % (ref 3.0–12.0)
Neutro Abs: 7.2 10*3/uL (ref 1.4–7.7)
Neutrophils Relative %: 75.8 % (ref 43.0–77.0)
Platelets: 363 10*3/uL (ref 150.0–400.0)
RBC: 4.16 Mil/uL — ABNORMAL LOW (ref 4.22–5.81)
RDW: 13.9 % (ref 11.5–15.5)
WBC: 9.6 10*3/uL (ref 4.0–10.5)

## 2023-03-05 LAB — LIPID PANEL
Cholesterol: 240 mg/dL — ABNORMAL HIGH (ref 0–200)
HDL: 72.8 mg/dL (ref >39.00)
LDL Cholesterol: 138 mg/dL — ABNORMAL HIGH (ref 0–99)
NonHDL: 166.96
Total CHOL/HDL Ratio: 3
Triglycerides: 144 mg/dL (ref 0.0–149.0)
VLDL: 28.8 mg/dL (ref 0.0–40.0)

## 2023-03-05 NOTE — Progress Notes (Signed)
Ryan Galloway 62 y.o.   Chief Complaint  Patient presents with   Medical Management of Chronic Issues    F/u appt, no concerns     HISTORY OF PRESENT ILLNESS: This is a 62 y.o. male A1A here for follow-up of hypertension and dyslipidemia Eating better and exercising more Normal blood pressure readings at home off medication Not taking cholesterol medication Overall doing well.  Has no complaints or medical concerns today. BP Readings from Last 3 Encounters:  03/05/23 124/76  10/10/21 138/78  03/16/21 136/80   Wt Readings from Last 3 Encounters:  03/05/23 179 lb 4 oz (81.3 kg)  10/10/21 196 lb (88.9 kg)  03/16/21 186 lb 6.4 oz (84.6 kg)     HPI   Prior to Admission medications   Medication Sig Start Date End Date Taking? Authorizing Provider  atorvastatin (LIPITOR) 40 MG tablet Take 1 daily for cholesterol 03/31/20  Yes Peyton Najjar, MD  lisinopril (ZESTRIL) 10 MG tablet Take 1 tablet (10 mg total) by mouth daily. 09/15/20  Yes Linkyn Gobin, Eilleen Kempf, MD  omega-3 acid ethyl esters (LOVAZA) 1 g capsule Take 2 twice daily for lipids 03/31/20  Yes Peyton Najjar, MD    Allergies  Allergen Reactions   Other Itching, Swelling and Rash    Food: coleslaw     Patient Active Problem List   Diagnosis Date Noted   Dyslipidemia 09/15/2020   Essential hypertension 02/18/2020   Mixed hypercholesterolemia and hypertriglyceridemia 02/18/2020    Past Medical History:  Diagnosis Date   Hyperlipidemia    Hypertension     No past surgical history on file.  Social History   Socioeconomic History   Marital status: Married    Spouse name: Not on file   Number of children: Not on file   Years of education: Not on file   Highest education level: Not on file  Occupational History   Not on file  Tobacco Use   Smoking status: Never   Smokeless tobacco: Never  Substance and Sexual Activity   Alcohol use: Yes    Alcohol/week: 1.0 - 2.0 standard drink of alcohol    Types:  1 - 2 Standard drinks or equivalent per week    Comment: occ   Drug use: Not on file   Sexual activity: Not on file  Other Topics Concern   Not on file  Social History Narrative   Not on file   Social Determinants of Health   Financial Resource Strain: Not on file  Food Insecurity: Not on file  Transportation Needs: Not on file  Physical Activity: Not on file  Stress: Not on file  Social Connections: Not on file  Intimate Partner Violence: Not on file    Family History  Problem Relation Age of Onset   Heart disease Father      Review of Systems  Constitutional: Negative.  Negative for chills and fever.  HENT: Negative.  Negative for congestion and sore throat.   Respiratory: Negative.  Negative for cough and shortness of breath.   Cardiovascular: Negative.  Negative for chest pain and palpitations.  Gastrointestinal:  Negative for abdominal pain, diarrhea, nausea and vomiting.  Genitourinary: Negative.  Negative for dysuria and hematuria.  Skin: Negative.  Negative for rash.  Neurological:  Negative for dizziness and headaches.  All other systems reviewed and are negative.   Vitals:   03/05/23 0821  BP: 124/76  Pulse: 76  Temp: 98.2 F (36.8 C)  SpO2: 100%  Physical Exam Vitals reviewed.  Constitutional:      Appearance: Normal appearance.  HENT:     Head: Normocephalic.     Mouth/Throat:     Mouth: Mucous membranes are moist.     Pharynx: Oropharynx is clear.  Eyes:     Extraocular Movements: Extraocular movements intact.     Pupils: Pupils are equal, round, and reactive to light.  Cardiovascular:     Rate and Rhythm: Normal rate and regular rhythm.     Pulses: Normal pulses.     Heart sounds: Normal heart sounds.  Pulmonary:     Effort: Pulmonary effort is normal.     Breath sounds: Normal breath sounds.  Abdominal:     Palpations: Abdomen is soft.     Tenderness: There is no abdominal tenderness.  Musculoskeletal:     Cervical back: No  tenderness.     Right lower leg: No edema.     Left lower leg: No edema.  Lymphadenopathy:     Cervical: No cervical adenopathy.  Skin:    General: Skin is warm and dry.  Neurological:     General: No focal deficit present.     Mental Status: He is alert and oriented to person, place, and time.  Psychiatric:        Mood and Affect: Mood normal.        Behavior: Behavior normal.      ASSESSMENT & PLAN: A total of 43 minutes was spent with the patient and counseling/coordination of care regarding preparing for this visit, review of most recent office visit notes, review of multiple chronic medical conditions under management, review of all medications, review of most recent blood work results, cardiovascular risks associated with hypertension and dyslipidemia, education on nutrition, review of health maintenance items and need for colon cancer screening, prognosis, documentation and need for follow-up.  Problem List Items Addressed This Visit       Cardiovascular and Mediastinum   Essential hypertension - Primary    Eating better and exercising more Well-controlled hypertension Off medications Normal blood pressure readings at home Cardiovascular risk associated with hypertension discussed Dietary approaches to stop hypertension discussed Blood work done today Follow-up in 6 months      Relevant Orders   CBC with Differential/Platelet   Comprehensive metabolic panel   Hemoglobin A1c   Lipid panel     Other   Dyslipidemia    Cardiovascular risks associated with dyslipidemia discussed Benefits of exercise discussed Diet and nutrition discussed Lipid profile done today Not taking atorvastatin The 10-year ASCVD risk score (Arnett DK, et al., 2019) is: 12.6%   Values used to calculate the score:     Age: 35 years     Sex: Male     Is Non-Hispanic African American: Yes     Diabetic: No     Tobacco smoker: No     Systolic Blood Pressure: 124 mmHg     Is BP treated:  Yes     HDL Cholesterol: 85.3 mg/dL     Total Cholesterol: 198 mg/dL       Relevant Orders   CBC with Differential/Platelet   Comprehensive metabolic panel   Hemoglobin A1c   Lipid panel   Other Visit Diagnoses     Screening for colon cancer       Relevant Orders   Cologuard      Patient Instructions  Health Maintenance, Male Adopting a healthy lifestyle and getting preventive care are important in promoting health and  wellness. Ask your health care provider about: The right schedule for you to have regular tests and exams. Things you can do on your own to prevent diseases and keep yourself healthy. What should I know about diet, weight, and exercise? Eat a healthy diet  Eat a diet that includes plenty of vegetables, fruits, low-fat dairy products, and lean protein. Do not eat a lot of foods that are high in solid fats, added sugars, or sodium. Maintain a healthy weight Body mass index (BMI) is a measurement that can be used to identify possible weight problems. It estimates body fat based on height and weight. Your health care provider can help determine your BMI and help you achieve or maintain a healthy weight. Get regular exercise Get regular exercise. This is one of the most important things you can do for your health. Most adults should: Exercise for at least 150 minutes each week. The exercise should increase your heart rate and make you sweat (moderate-intensity exercise). Do strengthening exercises at least twice a week. This is in addition to the moderate-intensity exercise. Spend less time sitting. Even light physical activity can be beneficial. Watch cholesterol and blood lipids Have your blood tested for lipids and cholesterol at 64 years of age, then have this test every 5 years. You may need to have your cholesterol levels checked more often if: Your lipid or cholesterol levels are high. You are older than 64 years of age. You are at high risk for heart  disease. What should I know about cancer screening? Many types of cancers can be detected early and may often be prevented. Depending on your health history and family history, you may need to have cancer screening at various ages. This may include screening for: Colorectal cancer. Prostate cancer. Skin cancer. Lung cancer. What should I know about heart disease, diabetes, and high blood pressure? Blood pressure and heart disease High blood pressure causes heart disease and increases the risk of stroke. This is more likely to develop in people who have high blood pressure readings or are overweight. Talk with your health care provider about your target blood pressure readings. Have your blood pressure checked: Every 3-5 years if you are 23-52 years of age. Every year if you are 36 years old or older. If you are between the ages of 59 and 81 and are a current or former smoker, ask your health care provider if you should have a one-time screening for abdominal aortic aneurysm (AAA). Diabetes Have regular diabetes screenings. This checks your fasting blood sugar level. Have the screening done: Once every three years after age 53 if you are at a normal weight and have a low risk for diabetes. More often and at a younger age if you are overweight or have a high risk for diabetes. What should I know about preventing infection? Hepatitis B If you have a higher risk for hepatitis B, you should be screened for this virus. Talk with your health care provider to find out if you are at risk for hepatitis B infection. Hepatitis C Blood testing is recommended for: Everyone born from [birth year] through 1967. Anyone with known risk factors for hepatitis C. Sexually transmitted infections (STIs) You should be screened each year for STIs, including gonorrhea and chlamydia, if: You are sexually active and are younger than 64 years of age. You are older than 64 years of age and your health care provider tells you  that you are at risk for this type of infection. Your  sexual activity has changed since you were last screened, and you are at increased risk for chlamydia or gonorrhea. Ask your health care provider if you are at risk. Ask your health care provider about whether you are at high risk for HIV. Your health care provider may recommend a prescription medicine to help prevent HIV infection. If you choose to take medicine to prevent HIV, you should first get tested for HIV. You should then be tested every 3 months for as long as you are taking the medicine. Follow these instructions at home: Alcohol use Do not drink alcohol if your health care provider tells you not to drink. If you drink alcohol: Limit how much you have to 0-2 drinks a day. Know how much alcohol is in your drink. In the [place] one drink equals one 12 oz bottle of beer (355 mL), one 5 oz glass of wine (148 mL), or one 1 oz glass of hard liquor (44 mL). Lifestyle Do not use any products that contain nicotine or tobacco. These products include cigarettes, chewing tobacco, and vaping devices, such as e-cigarettes. If you need help quitting, ask your health care provider. Do not use street drugs. Do not share needles. Ask your health care provider for help if you need support or information about quitting drugs. General instructions Schedule regular health, dental, and eye exams. Stay current with your vaccines. Tell your health care provider if: You often feel depressed. You have ever been abused or do not feel safe at home. Summary Adopting a healthy lifestyle and getting preventive care are important in promoting health and wellness. Follow your health care provider's instructions about healthy diet, exercising, and getting tested or screened for diseases. Follow your health care provider's instructions on monitoring your cholesterol and blood pressure. This information is not intended to replace advice given to you by your health  care provider. Make sure you discuss any questions you have with your health care provider. Document Revised: 03/27/2021 Document Reviewed: 03/27/2021 Elsevier Patient Education  2023 Elsevier Inc.    Edwina Barth, MD [facility]

## 2023-03-05 NOTE — Assessment & Plan Note (Signed)
Eating better and exercising more Well-controlled hypertension Off medications Normal blood pressure readings at home Cardiovascular risk associated with hypertension discussed Dietary approaches to stop hypertension discussed Blood work done today Follow-up in 6 months

## 2023-03-05 NOTE — Patient Instructions (Signed)
Health Maintenance, Male Adopting a healthy lifestyle and getting preventive care are important in promoting health and wellness. Ask your health care provider about: The right schedule for you to have regular tests and exams. Things you can do on your own to prevent diseases and keep yourself healthy. What should I know about diet, weight, and exercise? Eat a healthy diet  Eat a diet that includes plenty of vegetables, fruits, low-fat dairy products, and lean protein. Do not eat a lot of foods that are high in solid fats, added sugars, or sodium. Maintain a healthy weight Body mass index (BMI) is a measurement that can be used to identify possible weight problems. It estimates body fat based on height and weight. Your health care provider can help determine your BMI and help you achieve or maintain a healthy weight. Get regular exercise Get regular exercise. This is one of the most important things you can do for your health. Most adults should: Exercise for at least 150 minutes each week. The exercise should increase your heart rate and make you sweat (moderate-intensity exercise). Do strengthening exercises at least twice a week. This is in addition to the moderate-intensity exercise. Spend less time sitting. Even light physical activity can be beneficial. Watch cholesterol and blood lipids Have your blood tested for lipids and cholesterol at 64 years of age, then have this test every 5 years. You may need to have your cholesterol levels checked more often if: Your lipid or cholesterol levels are high. You are older than 64 years of age. You are at high risk for heart disease. What should I know about cancer screening? Many types of cancers can be detected early and may often be prevented. Depending on your health history and family history, you may need to have cancer screening at various ages. This may include screening for: Colorectal cancer. Prostate cancer. Skin cancer. Lung  cancer. What should I know about heart disease, diabetes, and high blood pressure? Blood pressure and heart disease High blood pressure causes heart disease and increases the risk of stroke. This is more likely to develop in people who have high blood pressure readings or are overweight. Talk with your health care provider about your target blood pressure readings. Have your blood pressure checked: Every 3-5 years if you are 18-39 years of age. Every year if you are 40 years old or older. If you are between the ages of 65 and 75 and are a current or former smoker, ask your health care provider if you should have a one-time screening for abdominal aortic aneurysm (AAA). Diabetes Have regular diabetes screenings. This checks your fasting blood sugar level. Have the screening done: Once every three years after age 45 if you are at a normal weight and have a low risk for diabetes. More often and at a younger age if you are overweight or have a high risk for diabetes. What should I know about preventing infection? Hepatitis B If you have a higher risk for hepatitis B, you should be screened for this virus. Talk with your health care provider to find out if you are at risk for hepatitis B infection. Hepatitis C Blood testing is recommended for: Everyone born from 1945 through 1965. Anyone with known risk factors for hepatitis C. Sexually transmitted infections (STIs) You should be screened each year for STIs, including gonorrhea and chlamydia, if: You are sexually active and are younger than 64 years of age. You are older than 64 years of age and your   health care provider tells you that you are at risk for this type of infection. Your sexual activity has changed since you were last screened, and you are at increased risk for chlamydia or gonorrhea. Ask your health care provider if you are at risk. Ask your health care provider about whether you are at high risk for HIV. Your health care provider  may recommend a prescription medicine to help prevent HIV infection. If you choose to take medicine to prevent HIV, you should first get tested for HIV. You should then be tested every 3 months for as long as you are taking the medicine. Follow these instructions at home: Alcohol use Do not drink alcohol if your health care provider tells you not to drink. If you drink alcohol: Limit how much you have to 0-2 drinks a day. Know how much alcohol is in your drink. In the [place] one drink equals one 12 oz bottle of beer (355 mL), one 5 oz glass of wine (148 mL), or one 1 oz glass of hard liquor (44 mL). Lifestyle Do not use any products that contain nicotine or tobacco. These products include cigarettes, chewing tobacco, and vaping devices, such as e-cigarettes. If you need help quitting, ask your health care provider. Do not use street drugs. Do not share needles. Ask your health care provider for help if you need support or information about quitting drugs. General instructions Schedule regular health, dental, and eye exams. Stay current with your vaccines. Tell your health care provider if: You often feel depressed. You have ever been abused or do not feel safe at home. Summary Adopting a healthy lifestyle and getting preventive care are important in promoting health and wellness. Follow your health care provider's instructions about healthy diet, exercising, and getting tested or screened for diseases. Follow your health care provider's instructions on monitoring your cholesterol and blood pressure. This information is not intended to replace advice given to you by your health care provider. Make sure you discuss any questions you have with your health care provider. Document Revised: 03/27/2021 Document Reviewed: 03/27/2021 Elsevier Patient Education  2023 Elsevier Inc.  

## 2023-03-05 NOTE — Assessment & Plan Note (Signed)
Cardiovascular risks associated with dyslipidemia discussed Benefits of exercise discussed Diet and nutrition discussed Lipid profile done today Not taking atorvastatin The 10-year ASCVD risk score (Arnett DK, et al., 2019) is: 12.6%   Values used to calculate the score:     Age: 62 years     Sex: Male     Is Non-Hispanic African American: Yes     Diabetic: No     Tobacco smoker: No     Systolic Blood Pressure: 124 mmHg     Is BP treated: Yes     HDL Cholesterol: 85.3 mg/dL     Total Cholesterol: 198 mg/dL

## 2023-04-01 ENCOUNTER — Telehealth: Payer: Self-pay | Admitting: Emergency Medicine

## 2023-04-01 NOTE — Telephone Encounter (Signed)
Patient called and has not received his cologuard test kit - please check on order.

## 2023-04-02 NOTE — Telephone Encounter (Signed)
Per last visit MD place order for Cologuard. Will send [employer] a msg

## 2023-04-26 ENCOUNTER — Other Ambulatory Visit: Payer: Self-pay | Admitting: *Deleted

## 2023-04-26 DIAGNOSIS — Z1211 Encounter for screening for malignant neoplasm of colon: Secondary | ICD-10-CM

## 2023-04-26 NOTE — Telephone Encounter (Signed)
Patient called and has not received his cologuard test.  He is very upset.

## 2023-04-26 NOTE — Telephone Encounter (Signed)
Called patient and informed him that the cologuard order was not ordered correctly. A new order has been placed and cologuard test should be sent out to patient

## 2023-08-06 ENCOUNTER — Encounter: Payer: Self-pay | Admitting: Emergency Medicine

## 2023-08-06 ENCOUNTER — Other Ambulatory Visit: Payer: Self-pay | Admitting: Emergency Medicine

## 2023-08-06 ENCOUNTER — Ambulatory Visit: Payer: BC Managed Care – PPO | Admitting: Emergency Medicine

## 2023-08-06 VITALS — BP 130/70 | HR 86 | Temp 98.0°F | Ht 72.0 in | Wt 167.4 lb

## 2023-08-06 DIAGNOSIS — Z1211 Encounter for screening for malignant neoplasm of colon: Secondary | ICD-10-CM

## 2023-08-06 DIAGNOSIS — E785 Hyperlipidemia, unspecified: Secondary | ICD-10-CM | POA: Diagnosis not present

## 2023-08-06 DIAGNOSIS — E782 Mixed hyperlipidemia: Secondary | ICD-10-CM

## 2023-08-06 DIAGNOSIS — L089 Local infection of the skin and subcutaneous tissue, unspecified: Secondary | ICD-10-CM | POA: Insufficient documentation

## 2023-08-06 DIAGNOSIS — I10 Essential (primary) hypertension: Secondary | ICD-10-CM | POA: Diagnosis not present

## 2023-08-06 LAB — COMPREHENSIVE METABOLIC PANEL
ALT: 14 U/L (ref 0–53)
AST: 28 U/L (ref 0–37)
Albumin: 4.3 g/dL (ref 3.5–5.2)
Alkaline Phosphatase: 74 U/L (ref 39–117)
BUN: 11 mg/dL (ref 6–23)
CO2: 23 meq/L (ref 19–32)
Calcium: 9.4 mg/dL (ref 8.4–10.5)
Chloride: 101 meq/L (ref 96–112)
Creatinine, Ser: 0.99 mg/dL (ref 0.40–1.50)
GFR: 80.48 mL/min (ref >60.00)
Glucose, Bld: 82 mg/dL (ref 70–99)
Potassium: 4.1 meq/L (ref 3.5–5.1)
Sodium: 133 meq/L — ABNORMAL LOW (ref 135–145)
Total Bilirubin: 0.4 mg/dL (ref 0.2–1.2)
Total Protein: 7.4 g/dL (ref 6.0–8.3)

## 2023-08-06 LAB — LIPID PANEL
Cholesterol: 261 mg/dL — ABNORMAL HIGH (ref 0–200)
HDL: 86.9 mg/dL (ref >39.00)
LDL Cholesterol: 128 mg/dL — ABNORMAL HIGH (ref 0–99)
NonHDL: 173.8
Total CHOL/HDL Ratio: 3
Triglycerides: 228 mg/dL — ABNORMAL HIGH (ref 0.0–149.0)
VLDL: 45.6 mg/dL — ABNORMAL HIGH (ref 0.0–40.0)

## 2023-08-06 MED ORDER — ATORVASTATIN CALCIUM 40 MG PO TABS
ORAL_TABLET | ORAL | 3 refills | Status: AC
Start: 2023-08-06 — End: ?

## 2023-08-06 NOTE — Patient Instructions (Signed)
Health Maintenance, Male Adopting a healthy lifestyle and getting preventive care are important in promoting health and wellness. Ask your health care provider about: The right schedule for you to have regular tests and exams. Things you can do on your own to prevent diseases and keep yourself healthy. What should I know about diet, weight, and exercise? Eat a healthy diet  Eat a diet that includes plenty of vegetables, fruits, low-fat dairy products, and lean protein. Do not eat a lot of foods that are high in solid fats, added sugars, or sodium. Maintain a healthy weight Body mass index (BMI) is a measurement that can be used to identify possible weight problems. It estimates body fat based on height and weight. Your health care provider can help determine your BMI and help you achieve or maintain a healthy weight. Get regular exercise Get regular exercise. This is one of the most important things you can do for your health. Most adults should: Exercise for at least 150 minutes each week. The exercise should increase your heart rate and make you sweat (moderate-intensity exercise). Do strengthening exercises at least twice a week. This is in addition to the moderate-intensity exercise. Spend less time sitting. Even light physical activity can be beneficial. Watch cholesterol and blood lipids Have your blood tested for lipids and cholesterol at 64 years of age, then have this test every 5 years. You may need to have your cholesterol levels checked more often if: Your lipid or cholesterol levels are high. You are older than 64 years of age. You are at high risk for heart disease. What should I know about cancer screening? Many types of cancers can be detected early and may often be prevented. Depending on your health history and family history, you may need to have cancer screening at various ages. This may include screening for: Colorectal cancer. Prostate cancer. Skin cancer. Lung  cancer. What should I know about heart disease, diabetes, and high blood pressure? Blood pressure and heart disease High blood pressure causes heart disease and increases the risk of stroke. This is more likely to develop in people who have high blood pressure readings or are overweight. Talk with your health care provider about your target blood pressure readings. Have your blood pressure checked: Every 3-5 years if you are 64-39 years of age. Every year if you are 40 years old or older. If you are between the ages of 64 and 75 and are a current or former smoker, ask your health care provider if you should have a one-time screening for abdominal aortic aneurysm (AAA). Diabetes Have regular diabetes screenings. This checks your fasting blood sugar level. Have the screening done: Once every three years after age 64 if you are at a normal weight and have a low risk for diabetes. More often and at a younger age if you are overweight or have a high risk for diabetes. What should I know about preventing infection? Hepatitis B If you have a higher risk for hepatitis B, you should be screened for this virus. Talk with your health care provider to find out if you are at risk for hepatitis B infection. Hepatitis C Blood testing is recommended for: Everyone born from 1945 through 1965. Anyone with known risk factors for hepatitis C. Sexually transmitted infections (STIs) You should be screened each year for STIs, including gonorrhea and chlamydia, if: You are sexually active and are younger than 64 years of age. You are older than 64 years of age and your   health care provider tells you that you are at risk for this type of infection. Your sexual activity has changed since you were last screened, and you are at increased risk for chlamydia or gonorrhea. Ask your health care provider if you are at risk. Ask your health care provider about whether you are at high risk for HIV. Your health care provider  may recommend a prescription medicine to help prevent HIV infection. If you choose to take medicine to prevent HIV, you should first get tested for HIV. You should then be tested every 3 months for as long as you are taking the medicine. Follow these instructions at home: Alcohol use Do not drink alcohol if your health care provider tells you not to drink. If you drink alcohol: Limit how much you have to 0-2 drinks a day. Know how much alcohol is in your drink. In the [place] one drink equals one 12 oz bottle of beer (355 mL), one 5 oz glass of wine (148 mL), or one 1 oz glass of hard liquor (44 mL). Lifestyle Do not use any products that contain nicotine or tobacco. These products include cigarettes, chewing tobacco, and vaping devices, such as e-cigarettes. If you need help quitting, ask your health care provider. Do not use street drugs. Do not share needles. Ask your health care provider for help if you need support or information about quitting drugs. General instructions Schedule regular health, dental, and eye exams. Stay current with your vaccines. Tell your health care provider if: You often feel depressed. You have ever been abused or do not feel safe at home. Summary Adopting a healthy lifestyle and getting preventive care are important in promoting health and wellness. Follow your health care provider's instructions about healthy diet, exercising, and getting tested or screened for diseases. Follow your health care provider's instructions on monitoring your cholesterol and blood pressure. This information is not intended to replace advice given to you by your health care provider. Make sure you discuss any questions you have with your health care provider. Document Revised: 03/27/2021 Document Reviewed: 03/27/2021 Elsevier Patient Education  2024 Elsevier Inc.  

## 2023-08-06 NOTE — Assessment & Plan Note (Signed)
Not taking atorvastatin at present time Lipid profile repeated today Diet and nutrition discussed. Cardiovascular risks associated with dyslipidemia discussed The 10-year ASCVD risk score (Arnett DK, et al., 2019) is: 15.1%   Values used to calculate the score:     Age: 62 years     Sex: Male     Is Non-Hispanic African American: Yes     Diabetic: No     Tobacco smoker: No     Systolic Blood Pressure: 130 mmHg     Is BP treated: Yes     HDL Cholesterol: 72.8 mg/dL     Total Cholesterol: 240 mg/dL

## 2023-08-06 NOTE — Assessment & Plan Note (Signed)
BP Readings from Last 3 Encounters:  08/06/23 (!) 140/86  03/05/23 124/76  10/10/21 138/78  Elevated blood pressure reading in the office but normal readings at home. Continue lisinopril 10 mg daily Cardiovascular risks associated with uncontrolled hypertension discussed Diet and nutrition discussed

## 2023-08-06 NOTE — Progress Notes (Signed)
Ryan Galloway 62 y.o.   Chief Complaint  Patient presents with   Medical Management of Chronic Issues    Patient states he has a swollen toe on his left foot, poss infected, been using OTC medication, not working     HISTORY OF PRESENT ILLNESS: This is a 62 y.o. male complaining of toe infection that started last week.  Started taking Bactrim DS twice a day last Friday.  Prescribed by family member States it is about 85% better.  Infection is on the left fourth toe. Also history of hypertension on lisinopril Dyslipidemia but not taking atorvastatin No other complaints or medical concerns today.  HPI   Prior to Admission medications   Medication Sig Start Date End Date Taking? Authorizing Provider  atorvastatin (LIPITOR) 40 MG tablet Take 1 daily for cholesterol 03/31/20  Yes Peyton Najjar, MD  lisinopril (ZESTRIL) 10 MG tablet Take 1 tablet (10 mg total) by mouth daily. 09/15/20  Yes Farouk Vivero, Eilleen Kempf, MD  omega-3 acid ethyl esters (LOVAZA) 1 g capsule Take 2 twice daily for lipids 03/31/20  Yes Peyton Najjar, MD  sulfamethoxazole-trimethoprim (BACTRIM DS) 800-160 MG tablet SMARTSIG:1 Tablet(s) By Mouth Every 12 Hours 08/02/23  Yes [provider]    Allergies  Allergen Reactions   Other Itching, Swelling and Rash    Food: coleslaw     Patient Active Problem List   Diagnosis Date Noted   Dyslipidemia 09/15/2020   Essential hypertension 02/18/2020   Mixed hypercholesterolemia and hypertriglyceridemia 02/18/2020    Past Medical History:  Diagnosis Date   Hyperlipidemia    Hypertension     No past surgical history on file.  Social History   Socioeconomic History   Marital status: Married    Spouse name: Not on file   Number of children: Not on file   Years of education: Not on file   Highest education level: Not on file  Occupational History   Not on file  Tobacco Use   Smoking status: Never   Smokeless tobacco: Never  Substance and Sexual  Activity   Alcohol use: Yes    Alcohol/week: 1.0 - 2.0 standard drink of alcohol    Types: 1 - 2 Standard drinks or equivalent per week    Comment: occ   Drug use: Not on file   Sexual activity: Not on file  Other Topics Concern   Not on file  Social History Narrative   Not on file   Social Determinants of Health   Financial Resource Strain: Not on file  Food Insecurity: Not on file  Transportation Needs: Not on file  Physical Activity: Not on file  Stress: Not on file  Social Connections: Not on file  Intimate Partner Violence: Not on file    Family History  Problem Relation Age of Onset   Heart disease Father      Review of Systems  Constitutional: Negative.  Negative for chills and fever.  HENT: Negative.  Negative for congestion and sore throat.   Respiratory: Negative.  Negative for cough and shortness of breath.   Cardiovascular: Negative.  Negative for chest pain and palpitations.  Gastrointestinal:  Negative for abdominal pain, diarrhea, nausea and vomiting.  Genitourinary: Negative.   Skin: Negative.  Negative for rash.  Neurological: Negative.  Negative for dizziness and headaches.  All other systems reviewed and are negative.   Vitals:   08/06/23 1031  BP: (!) 140/86  Pulse: 86  Temp: 98 F (36.7 C)  SpO2: 98%  Physical Exam Vitals reviewed.  Constitutional:      Appearance: Normal appearance.  HENT:     Head: Normocephalic.  Eyes:     Extraocular Movements: Extraocular movements intact.  Cardiovascular:     Rate and Rhythm: Normal rate and regular rhythm.     Pulses: Normal pulses.     Heart sounds: Normal heart sounds.  Pulmonary:     Effort: Pulmonary effort is normal.     Breath sounds: Normal breath sounds.  Musculoskeletal:     Comments: Left fourth toe: Some swelling with mild erythema.  Mild tenderness.  Neurovascularly intact.  Skin:    General: Skin is warm and dry.     Capillary Refill: Capillary refill takes less than 2  seconds.  Neurological:     Mental Status: He is alert and oriented to person, place, and time.  Psychiatric:        Mood and Affect: Mood normal.        Behavior: Behavior normal.      ASSESSMENT & PLAN: A total of 42 minutes was spent with the patient and counseling/coordination of care regarding preparing for this visit, review of most recent office visit notes, review of chronic medical conditions under management, review of all medications, review of most recent blood work results, cardiovascular risks associated with hypertension and dyslipidemia, diagnosis and treatment of toe infection, education on nutrition, prognosis, documentation, and need for follow-up.  Problem List Items Addressed This Visit       Cardiovascular and Mediastinum   Essential hypertension    BP Readings from Last 3 Encounters:  08/06/23 (!) 140/86  03/05/23 124/76  10/10/21 138/78  Elevated blood pressure reading in the office but normal readings at home. Continue lisinopril 10 mg daily Cardiovascular risks associated with uncontrolled hypertension discussed Diet and nutrition discussed       Relevant Orders   Comprehensive metabolic panel   Lipid panel     Other   Dyslipidemia    Not taking atorvastatin at present time Lipid profile repeated today Diet and nutrition discussed. Cardiovascular risks associated with dyslipidemia discussed The 10-year ASCVD risk score (Arnett DK, et al., 2019) is: 15.1%   Values used to calculate the score:     Age: 81 years     Sex: Male     Is Non-Hispanic African American: Yes     Diabetic: No     Tobacco smoker: No     Systolic Blood Pressure: 130 mmHg     Is BP treated: Yes     HDL Cholesterol: 72.8 mg/dL     Total Cholesterol: 240 mg/dL       Relevant Orders   Comprehensive metabolic panel   Lipid panel   Toe infection - Primary    Responding well to Bactrim DS twice a day Continue and finish 10 days of treatment Recommend podiatry  follow-up Referral placed today      Relevant Medications   sulfamethoxazole-trimethoprim (BACTRIM DS) 800-160 MG tablet   Other Relevant Orders   Ambulatory referral to Podiatry   Other Visit Diagnoses     Screening for colon cancer       Relevant Orders   Cologuard        Patient Instructions  Health Maintenance, Male Adopting a healthy lifestyle and getting preventive care are important in promoting health and wellness. Ask your health care provider about: The right schedule for you to have regular tests and exams. Things you can do on your own to  prevent diseases and keep yourself healthy. What should I know about diet, weight, and exercise? Eat a healthy diet  Eat a diet that includes plenty of vegetables, fruits, low-fat dairy products, and lean protein. Do not eat a lot of foods that are high in solid fats, added sugars, or sodium. Maintain a healthy weight Body mass index (BMI) is a measurement that can be used to identify possible weight problems. It estimates body fat based on height and weight. Your health care provider can help determine your BMI and help you achieve or maintain a healthy weight. Get regular exercise Get regular exercise. This is one of the most important things you can do for your health. Most adults should: Exercise for at least 150 minutes each week. The exercise should increase your heart rate and make you sweat (moderate-intensity exercise). Do strengthening exercises at least twice a week. This is in addition to the moderate-intensity exercise. Spend less time sitting. Even light physical activity can be beneficial. Watch cholesterol and blood lipids Have your blood tested for lipids and cholesterol at 64 years of age, then have this test every 5 years. You may need to have your cholesterol levels checked more often if: Your lipid or cholesterol levels are high. You are older than 64 years of age. You are at high risk for heart  disease. What should I know about cancer screening? Many types of cancers can be detected early and may often be prevented. Depending on your health history and family history, you may need to have cancer screening at various ages. This may include screening for: Colorectal cancer. Prostate cancer. Skin cancer. Lung cancer. What should I know about heart disease, diabetes, and high blood pressure? Blood pressure and heart disease High blood pressure causes heart disease and increases the risk of stroke. This is more likely to develop in people who have high blood pressure readings or are overweight. Talk with your health care provider about your target blood pressure readings. Have your blood pressure checked: Every 3-5 years if you are 69-40 years of age. Every year if you are 51 years old or older. If you are between the ages of 41 and 75 and are a current or former smoker, ask your health care provider if you should have a one-time screening for abdominal aortic aneurysm (AAA). Diabetes Have regular diabetes screenings. This checks your fasting blood sugar level. Have the screening done: Once every three years after age 34 if you are at a normal weight and have a low risk for diabetes. More often and at a younger age if you are overweight or have a high risk for diabetes. What should I know about preventing infection? Hepatitis B If you have a higher risk for hepatitis B, you should be screened for this virus. Talk with your health care provider to find out if you are at risk for hepatitis B infection. Hepatitis C Blood testing is recommended for: Everyone born from [birth year] through 1965. Anyone with known risk factors for hepatitis C. Sexually transmitted infections (STIs) You should be screened each year for STIs, including gonorrhea and chlamydia, if: You are sexually active and are younger than 64 years of age. You are older than 64 years of age and your health care provider tells you  that you are at risk for this type of infection. Your sexual activity has changed since you were last screened, and you are at increased risk for chlamydia or gonorrhea. Ask your health care provider if you  are at risk. Ask your health care provider about whether you are at high risk for HIV. Your health care provider may recommend a prescription medicine to help prevent HIV infection. If you choose to take medicine to prevent HIV, you should first get tested for HIV. You should then be tested every 3 months for as long as you are taking the medicine. Follow these instructions at home: Alcohol use Do not drink alcohol if your health care provider tells you not to drink. If you drink alcohol: Limit how much you have to 0-2 drinks a day. Know how much alcohol is in your drink. In the [place] one drink equals one 12 oz bottle of beer (355 mL), one 5 oz glass of wine (148 mL), or one 1 oz glass of hard liquor (44 mL). Lifestyle Do not use any products that contain nicotine or tobacco. These products include cigarettes, chewing tobacco, and vaping devices, such as e-cigarettes. If you need help quitting, ask your health care provider. Do not use street drugs. Do not share needles. Ask your health care provider for help if you need support or information about quitting drugs. General instructions Schedule regular health, dental, and eye exams. Stay current with your vaccines. Tell your health care provider if: You often feel depressed. You have ever been abused or do not feel safe at home. Summary Adopting a healthy lifestyle and getting preventive care are important in promoting health and wellness. Follow your health care provider's instructions about healthy diet, exercising, and getting tested or screened for diseases. Follow your health care provider's instructions on monitoring your cholesterol and blood pressure. This information is not intended to replace advice given to you by your health  care provider. Make sure you discuss any questions you have with your health care provider. Document Revised: 03/27/2021 Document Reviewed: 03/27/2021 Elsevier Patient Education  2024 Elsevier Inc.      Edwina Barth, MD [facility]

## 2023-08-06 NOTE — Assessment & Plan Note (Signed)
Responding well to Bactrim DS twice a day Continue and finish 10 days of treatment Recommend podiatry follow-up Referral placed today

## 2023-08-15 ENCOUNTER — Encounter: Payer: Self-pay | Admitting: Podiatry

## 2023-08-15 ENCOUNTER — Ambulatory Visit (INDEPENDENT_AMBULATORY_CARE_PROVIDER_SITE_OTHER): Payer: BC Managed Care – PPO | Admitting: Podiatry

## 2023-08-15 VITALS — BP 123/81 | HR 73

## 2023-08-15 DIAGNOSIS — L600 Ingrowing nail: Secondary | ICD-10-CM

## 2023-08-15 NOTE — Progress Notes (Signed)
Subjective:   Patient ID: Ryan Galloway, male   DOB: 62 y.o.   MRN: [id number]   HPI Patient presents stating he has had a painful thick fourth nail of his left foot and it has been sore and hard for him to wear shoe gear with.  He has tried to trim it and soak it and it has been going on for several months and has tried over-the-counter fungus medicines and antibiotics.  Patient does not smoke likes to be active   Review of Systems  All other systems reviewed and are negative.       Objective:  Physical Exam Vitals and nursing note reviewed.  Constitutional:      Appearance: He is well-developed.  Pulmonary:     Effort: Pulmonary effort is normal.  Musculoskeletal:        General: Normal range of motion.  Skin:    General: Skin is warm.  Neurological:     Mental Status: He is alert.     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  Patient wants to have this done understands procedure and I went ahead and I anesthetized the left fourth toe 60 mg Xylocaine Marcaine mixture sterile prep done using sterile instrumentation removed the nail exposed matrix applied phenol for applications 30 seconds followed by alcohol lavage sterile dressing gave instructions on soaks wear dressing 24 hours take it off earlier if throbbing were to occur and debrided the distal tissue

## 2023-08-15 NOTE — Patient Instructions (Signed)

## 2023-09-26 ENCOUNTER — Encounter: Payer: Self-pay | Admitting: Podiatry

## 2023-09-26 ENCOUNTER — Ambulatory Visit (INDEPENDENT_AMBULATORY_CARE_PROVIDER_SITE_OTHER): Payer: BC Managed Care – PPO | Admitting: Podiatry

## 2023-09-26 ENCOUNTER — Ambulatory Visit (INDEPENDENT_AMBULATORY_CARE_PROVIDER_SITE_OTHER): Payer: BC Managed Care – PPO

## 2023-09-26 VITALS — Ht 72.0 in | Wt 167.4 lb

## 2023-09-26 DIAGNOSIS — L600 Ingrowing nail: Secondary | ICD-10-CM | POA: Diagnosis not present

## 2023-09-26 DIAGNOSIS — M79672 Pain in left foot: Secondary | ICD-10-CM | POA: Diagnosis not present

## 2023-09-26 NOTE — Progress Notes (Signed)
Subjective:   Patient ID: Ryan Galloway, male   DOB: 62 y.o.   MRN: [id number]   HPI Patient presents with discoloration of his left fourth toe after having had an ingrown removed with moderate discomfort at times with pressure on the fourth toe from the third toe occurring.  No drainage currently    ROS      Objective:  Physical Exam  History of ingrown toenail deformity of the left fourth nailbed that was removed is healed well but there is some discoloration of the distal toe around the inner phalangeal joint     Assessment:  Cannot rule out any underlying bone pathology or it may be just the recovery with pressure from the third digit     Plan:  H&P reviewed and recommended the continuation of conservative care and reviewed x-ray with him.  I do not see any issues and it continues to heal and should continue to heal and if any issues were to occur let us know x-rays were negative for signs of osteolysis or any resorption

## 2023-12-30 DIAGNOSIS — Z1211 Encounter for screening for malignant neoplasm of colon: Secondary | ICD-10-CM | POA: Diagnosis not present

## 2024-01-06 LAB — COLOGUARD: COLOGUARD: NEGATIVE
